# Patient Record
Sex: Male | Born: 1955 | Race: White | Hispanic: No | Marital: Single | Smoking: Current every day smoker
Health system: Southern US, Community
[De-identification: ages and names within clinical notes are randomized; demographics above are authoritative.]

## PROBLEM LIST (undated history)

## (undated) DIAGNOSIS — I219 Acute myocardial infarction, unspecified: Secondary | ICD-10-CM

## (undated) DIAGNOSIS — S065XAA Traumatic subdural hemorrhage with loss of consciousness status unknown, initial encounter: Secondary | ICD-10-CM

## (undated) DIAGNOSIS — I10 Essential (primary) hypertension: Secondary | ICD-10-CM

## (undated) DIAGNOSIS — I509 Heart failure, unspecified: Secondary | ICD-10-CM

## (undated) DIAGNOSIS — I619 Nontraumatic intracerebral hemorrhage, unspecified: Secondary | ICD-10-CM

## (undated) DIAGNOSIS — S065X9A Traumatic subdural hemorrhage with loss of consciousness of unspecified duration, initial encounter: Secondary | ICD-10-CM

## (undated) HISTORY — PX: CRANIECTOMY: SHX331

---

## 2014-09-17 ENCOUNTER — Encounter (HOSPITAL_COMMUNITY): Payer: Self-pay | Admitting: *Deleted

## 2014-09-17 ENCOUNTER — Emergency Department (HOSPITAL_COMMUNITY): Payer: Medicaid Other

## 2014-09-17 ENCOUNTER — Emergency Department (HOSPITAL_COMMUNITY)
Admission: EM | Admit: 2014-09-17 | Discharge: 2014-09-17 | Disposition: A | Discharge status: Home / Self Care | Payer: Medicaid Other | Attending: Emergency Medicine | Admitting: Emergency Medicine

## 2014-09-17 DIAGNOSIS — Z8781 Personal history of (healed) traumatic fracture: Secondary | ICD-10-CM | POA: Diagnosis not present

## 2014-09-17 DIAGNOSIS — R0989 Other specified symptoms and signs involving the circulatory and respiratory systems: Secondary | ICD-10-CM | POA: Insufficient documentation

## 2014-09-17 DIAGNOSIS — R079 Chest pain, unspecified: Secondary | ICD-10-CM | POA: Diagnosis present

## 2014-09-17 DIAGNOSIS — I25118 Atherosclerotic heart disease of native coronary artery with other forms of angina pectoris: Secondary | ICD-10-CM | POA: Insufficient documentation

## 2014-09-17 DIAGNOSIS — Z9861 Coronary angioplasty status: Secondary | ICD-10-CM | POA: Insufficient documentation

## 2014-09-17 DIAGNOSIS — I252 Old myocardial infarction: Secondary | ICD-10-CM | POA: Insufficient documentation

## 2014-09-17 DIAGNOSIS — Z720 Tobacco use: Secondary | ICD-10-CM | POA: Diagnosis not present

## 2014-09-17 DIAGNOSIS — I208 Other forms of angina pectoris: Secondary | ICD-10-CM

## 2014-09-17 DIAGNOSIS — Z79899 Other long term (current) drug therapy: Secondary | ICD-10-CM | POA: Insufficient documentation

## 2014-09-17 DIAGNOSIS — Z87828 Personal history of other (healed) physical injury and trauma: Secondary | ICD-10-CM | POA: Diagnosis not present

## 2014-09-17 DIAGNOSIS — I10 Essential (primary) hypertension: Secondary | ICD-10-CM | POA: Insufficient documentation

## 2014-09-17 HISTORY — DX: Essential (primary) hypertension: I10

## 2014-09-17 HISTORY — DX: Nontraumatic intracerebral hemorrhage, unspecified: I61.9

## 2014-09-17 HISTORY — DX: Acute myocardial infarction, unspecified: I21.9

## 2014-09-17 LAB — BASIC METABOLIC PANEL
ANION GAP: 12 (ref 5–15)
BUN: 24 mg/dL — AB (ref 6–23)
CO2: 24 mmol/L (ref 19–32)
Calcium: 8.3 mg/dL — ABNORMAL LOW (ref 8.4–10.5)
Chloride: 97 mmol/L (ref 96–112)
Creatinine, Ser: 1.09 mg/dL (ref 0.50–1.35)
GFR calc Af Amer: 85 mL/min — ABNORMAL LOW (ref >90)
GFR, EST NON AFRICAN AMERICAN: 74 mL/min — AB (ref >90)
Glucose, Bld: 98 mg/dL (ref 70–99)
Potassium: 4 mmol/L (ref 3.5–5.1)
Sodium: 133 mmol/L — ABNORMAL LOW (ref 135–145)

## 2014-09-17 LAB — TROPONIN I
TROPONIN I: 0.05 ng/mL — AB (ref <0.031)
TROPONIN I: 0.05 ng/mL — AB (ref <0.031)

## 2014-09-17 LAB — CBC WITH DIFFERENTIAL/PLATELET
BASOS ABS: 0 10*3/uL (ref 0.0–0.1)
Basophils Relative: 1 % (ref 0–1)
EOS PCT: 1 % (ref 0–5)
Eosinophils Absolute: 0 10*3/uL (ref 0.0–0.7)
HEMATOCRIT: 40.4 % (ref 39.0–52.0)
Hemoglobin: 12.7 g/dL — ABNORMAL LOW (ref 13.0–17.0)
LYMPHS ABS: 1.3 10*3/uL (ref 0.7–4.0)
Lymphocytes Relative: 34 % (ref 12–46)
MCH: 29.4 pg (ref 26.0–34.0)
MCHC: 31.4 g/dL (ref 30.0–36.0)
MCV: 93.5 fL (ref 78.0–100.0)
Monocytes Absolute: 0.4 10*3/uL (ref 0.1–1.0)
Monocytes Relative: 10 % (ref 3–12)
NEUTROS ABS: 2.1 10*3/uL (ref 1.7–7.7)
NEUTROS PCT: 54 % (ref 43–77)
Platelets: 119 10*3/uL — ABNORMAL LOW (ref 150–400)
RBC: 4.32 MIL/uL (ref 4.22–5.81)
RDW: 16.2 % — AB (ref 11.5–15.5)
WBC: 3.9 10*3/uL — AB (ref 4.0–10.5)

## 2014-09-17 LAB — BRAIN NATRIURETIC PEPTIDE: B Natriuretic Peptide: 1038.1 pg/mL — ABNORMAL HIGH (ref 0.0–100.0)

## 2014-09-17 MED ORDER — NITROGLYCERIN 0.4 MG SL SUBL: 0.4000 mg | SUBLINGUAL_TABLET | SUBLINGUAL | Status: AC | PRN

## 2014-09-17 NOTE — ED Notes (Signed)
The pt fell 10 days ago and  Fractured ribs on thelt middlke ribs.  He has been having chest pain since then

## 2014-09-17 NOTE — ED Notes (Signed)
Patient transported to X-ray 

## 2014-09-17 NOTE — ED Provider Notes (Signed)
CSN: [id number]     Arrival date & time 09/17/14  0003 History  This chart was scribed for Mancel Bale, MD by Tanda Rockers, ED Scribe. This patient was seen in room A08C/A08C and the patient's care was started at 1:04 AM.    Chief Complaint  Patient presents with  . Chest Pain   The history is provided by the patient. No language interpreter was used.   HPI Comments: Mitchell Aguilar is a 59 y.o. male with hx CAD s/p multiple stents, MI, and HTN who presents to the Emergency Department complaining of left sided chest pain that began 8 days ago s/p ground level fall, worsening 1 hour ago. Pt rates the pain as a 7/10 on the pain scale. He also complains of nausea. Pt states that he fell 8 days ago and fractured a couple of ribs, causing the pain. He was seen at [facility] after the fall. Pt mentions he has been taking nitroglycerin PRN (about 3-4 times per week) but is currently out of his medication. He denies shortness of breath, diaphoresis, vomiting, or any other symptoms. He is a current every day smoker and occasional EtOH drinker. Denies recreational drug usage.   Past Medical History  Diagnosis Date  . MI (myocardial infarction)   . Cerebral hemorrhage   . Hypertension    History reviewed. No pertinent past surgical history. No family history on file. History  Substance Use Topics  . Smoking status: Current Every Day Smoker  . Smokeless tobacco: Not on file  . Alcohol Use: Not on file    Review of Systems  Constitutional: Negative for diaphoresis.  Respiratory: Negative for shortness of breath.   Cardiovascular: Positive for chest pain.  Gastrointestinal: Positive for nausea. Negative for vomiting.  All other systems reviewed and are negative.     Allergies  Keflex and Heparin  Home Medications   Prior to Admission medications   Medication Sig Start Date End Date Taking? Authorizing Provider  furosemide (LASIX) 80 MG tablet Take 80 mg by mouth 2 (two) times  daily.   Yes Historical Provider, MD  gabapentin (NEURONTIN) 300 MG capsule Take 300 mg by mouth 3 (three) times daily.   Yes Historical Provider, MD  lisinopril (PRINIVIL,ZESTRIL) 10 MG tablet Take 10 mg by mouth daily.   Yes Historical Provider, MD  nitroGLYCERIN (NITROSTAT) 0.4 MG SL tablet Place 0.4 mg under the tongue every 5 (five) minutes as needed for chest pain.   Yes Historical Provider, MD  oxyCODONE-acetaminophen (PERCOCET/ROXICET) 5-325 MG per tablet Take 1 tablet by mouth 3 (three) times daily as needed for severe pain.   Yes Historical Provider, MD  simvastatin (ZOCOR) 40 MG tablet Take 40 mg by mouth daily.   Yes Historical Provider, MD   Triage Vitals: BP 103/83 mmHg  Pulse 109  Temp(Src) 98.2 F (36.8 C) (Oral)  Resp 28  Wt 192 lb 8 oz (87.317 kg)  SpO2 100%   Physical Exam  Constitutional: He is oriented to person, place, and time. He appears well-developed and well-nourished.  HENT:  Head: Normocephalic.  Right Ear: External ear normal.  Left Ear: External ear normal.  Bruising to left cheek.   Eyes: Conjunctivae and EOM are normal. Pupils are equal, round, and reactive to light.  Neck: Normal range of motion and phonation normal. Neck supple.  Cardiovascular: Normal rate, regular rhythm and normal heart sounds.   Left anterior chest wall tenderness without crepitation. Ecchymosis on left anterior chest wall.   Pulmonary/Chest:  Effort normal. He has rhonchi. He exhibits no bony tenderness.  Bilateral rhonchi left greater than right.    Abdominal: Soft. There is no tenderness.  Musculoskeletal: Normal range of motion.  Neurological: He is alert and oriented to person, place, and time. No cranial nerve deficit or sensory deficit. He exhibits normal muscle tone. Coordination normal.  Skin: Skin is warm, dry and intact.  Psychiatric: He has a normal mood and affect. His behavior is normal. Judgment and thought content normal.  Nursing note and vitals  reviewed.   ED Course  Procedures (including critical care time)  DIAGNOSTIC STUDIES: Oxygen Saturation is 100% on RA, normal by my interpretation.    COORDINATION OF CARE:  Medications - No data to display   Patient Vitals for the past 24 hrs:  BP Temp Temp src Pulse Resp SpO2 Weight  09/17/14 0045 100/76 mmHg - - 97 22 100 % -  09/17/14 0025 103/83 mmHg 98.2 F (36.8 C) Oral 109 (!) 28 100 % 192 lb 8 oz (87.317 kg)     1:10 AM-Discussed treatment plan which includes CXR, CBC, Troponin, BMP, BNP with pt at bedside and pt agreed to plan.  4:32 AM Reevaluation with update and discussion. After initial assessment and treatment, an updated evaluation reveals he is comfortable at this time. Findings discussed with patient, all questions answered.Mancel Bale L    Labs Review Labs Reviewed  BASIC METABOLIC PANEL - Abnormal; Notable for the following:    Sodium 133 (*)    BUN 24 (*)    Calcium 8.3 (*)    GFR calc non Af Amer 74 (*)    GFR calc Af Amer 85 (*)    All other components within normal limits  BRAIN NATRIURETIC PEPTIDE - Abnormal; Notable for the following:    B Natriuretic Peptide 1038.1 (*)    All other components within normal limits  CBC WITH DIFFERENTIAL/PLATELET - Abnormal; Notable for the following:    WBC 3.9 (*)    Hemoglobin 12.7 (*)    RDW 16.2 (*)    Platelets 119 (*)    All other components within normal limits  TROPONIN I - Abnormal; Notable for the following:    Troponin I 0.05 (*)    All other components within normal limits  TROPONIN I    Imaging Review Dg Chest 2 View  09/17/2014   CLINICAL DATA:  Chest pain.  Fall 10 days prior with rib fractures.  EXAM: CHEST  2 VIEW  COMPARISON:  Chest radiograph 09/13/2014  FINDINGS: Single lead pacemaker relies the left chest wall. Cardiomediastinal contours are unchanged, mild cardiomegaly is stable. Curvilinear calcifications again seen in the region of the left ventricle. Diffuse peribronchial  thickening and interstitial prominence is again seen, mildly progressed from prior. Question of developing small left pleural effusion and minimal fluid in the right minor fissure. No pneumothorax. There is no definite confluent airspace disease. Fractures at least 3 contiguous left upper ribs.  IMPRESSION: 1. Diffuse bronchial thickening and interstitial prominence is again seen. This may reflect worsening interstitial edema versus atypical infection. Question of developing small left pleural effusion. 2. Stable borderline mild cardiomegaly and curvilinear calcifications projecting over the region of the left ventricle. 3. Left upper lateral rib fractures.  No pneumothorax.   Electronically Signed   By: Rubye Oaks M.D.   On: 09/17/2014 02:44     EKG Interpretation   Date/Time:  Saturday September 17 2014 00:10:40 EDT Ventricular Rate:  105 PR Interval:  152 QRS Duration: 132 QT Interval:  354 QTC Calculation: 467 R Axis:   -93 Text Interpretation:   Poor data quality, interpretation may be  adversely affected Sinus tachycardia with occasional Premature ventricular  complexes Non-specific intra-ventricular conduction block Lateral infarct  , age undetermined Abnormal ECG No old tracing to compare Confirmed by  OTTER  MD, OLGA ([id number]) on 09/17/2014 12:17:27 AM      MDM   Final diagnoses:  Stable angina   Evaluation consistent with chronic stable angina. No evidence for ACS, PE or pneumonia.   Nursing Notes Reviewed/ Care Coordinated Applicable Imaging Reviewed Interpretation of Laboratory Data incorporated into ED treatment  The patient appears reasonably screened and/or stabilized for discharge and I doubt any other medical condition or other [facility] requiring further screening, evaluation, or treatment in the ED at this time prior to discharge.  Plan: Home Medications- refil NTG Rx; Home Treatments- rest; return here if the recommended treatment, does not improve the symptoms;  Recommended follow up- PCP 1 weel, Cardiology prn    I personally performed the services described in this documentation, which was scribed in my presence. The recorded information has been reviewed and is accurate.       Mancel BaleElliott Mattox Schorr, MD 09/17/14 [phone]

## 2014-09-17 NOTE — Discharge Instructions (Signed)
Angina Pectoris  Angina pectoris, often just called angina, is extreme discomfort in your chest, neck, or arm caused by a lack of blood in the middle and thickest layer of your heart wall (myocardium). It may feel like tightness or heavy pressure. It may feel like a crushing or squeezing pain. Some people say it feels like gas or indigestion. It may go down your shoulders, back, and arms. Some people may have symptoms other than pain. These symptoms include fatigue, shortness of breath, cold sweats, or nausea. There are four different types of angina:  · Stable angina--Stable angina usually occurs in episodes of predictable frequency and duration. It usually is brought on by physical activity, emotional stress, or excitement. These are all times when the myocardium needs more oxygen. Stable angina usually lasts a few minutes and often is relieved by taking a medicine that can be taken under your tongue (sublingually). The medicine is called nitroglycerin. Stable angina is caused by a buildup of plaque inside the arteries, which restricts blood flow to the heart muscle (atherosclerosis).  · Unstable angina--Unstable angina can occur even when your body experiences little or no physical exertion. It can occur during sleep. It can also occur at rest. It can suddenly increase in severity or frequency. It might not be relieved by sublingual nitroglycerin. It can last up to 30 minutes. The most common cause of unstable angina is a blood clot that has developed on the top of plaque buildup inside a coronary artery. It can lead to a heart attack if the blood clot completely blocks the artery.  · Microvascular angina--This type of angina is caused by a disorder of tiny blood vessels called arterioles. Microvascular angina is more common in women. The pain may be more severe and last longer than other types of angina pectoris.  · Prinzmetal or variant angina--This type of angina pectoris usually occurs when your body  experiences little or no physical exertion. It especially occurs in the early morning hours. It is caused by a spasm of your coronary artery.  HOME CARE INSTRUCTIONS   · Only take over-the-counter and prescription medicines as directed by your health care provider.  · Stay active or increase your exercise as directed by your health care provider.  · Limit strenuous activity as directed by your health care provider.  · Limit heavy lifting as directed by your health care provider.  · Maintain a healthy weight.  · Learn about and eat heart-healthy foods.  · Do not use any tobacco products including cigarettes, chewing tobacco or electronic cigarettes.  SEEK IMMEDIATE MEDICAL CARE IF:   You experience the following symptoms:  · Chest, neck, deep shoulder, or arm pain or discomfort that lasts more than a few minutes.  · Chest, neck, deep shoulder, or arm pain or discomfort that goes away and comes back, repeatedly.  · Heavy sweating with discomfort, without a noticeable cause.  · Shortness of breath or difficulty breathing.  · Angina that does not get better after a few minutes of rest or after taking sublingual nitroglycerin.  These can all be symptoms of a heart attack, which is a medical emergency! Get medical help at once. Call your local emergency service (911 in [place] immediately. Do not  drive yourself to the hospital and do not  wait to for your symptoms to go away.  MAKE SURE YOU:  · Understand these instructions.  · Will watch your condition.  · Will get help right away if you are not   doing well or get worse.  Document Released: 05/20/2005 Document Revised: 05/25/2013 Document Reviewed: 09/21/2013  ExitCare® Patient Information ©2015 [employer]. This information is not intended to replace advice given to you by your health care provider. Make sure you discuss any questions you have with your health care provider.

## 2014-09-19 ENCOUNTER — Emergency Department (HOSPITAL_COMMUNITY)
Admission: EM | Admit: 2014-09-19 | Discharge: 2014-09-19 | Discharge status: Against Medical Advice | Payer: Medicaid Other | Attending: Emergency Medicine | Admitting: Emergency Medicine

## 2014-09-19 ENCOUNTER — Emergency Department (HOSPITAL_COMMUNITY): Payer: Medicaid Other

## 2014-09-19 ENCOUNTER — Encounter (HOSPITAL_COMMUNITY): Payer: Self-pay | Admitting: Emergency Medicine

## 2014-09-19 DIAGNOSIS — I252 Old myocardial infarction: Secondary | ICD-10-CM | POA: Diagnosis not present

## 2014-09-19 DIAGNOSIS — I10 Essential (primary) hypertension: Secondary | ICD-10-CM | POA: Diagnosis not present

## 2014-09-19 DIAGNOSIS — Z720 Tobacco use: Secondary | ICD-10-CM | POA: Insufficient documentation

## 2014-09-19 DIAGNOSIS — R079 Chest pain, unspecified: Secondary | ICD-10-CM | POA: Insufficient documentation

## 2014-09-19 LAB — CBC
HCT: 38.7 % — ABNORMAL LOW (ref 39.0–52.0)
Hemoglobin: 12.2 g/dL — ABNORMAL LOW (ref 13.0–17.0)
MCH: 29.1 pg (ref 26.0–34.0)
MCHC: 31.5 g/dL (ref 30.0–36.0)
MCV: 92.4 fL (ref 78.0–100.0)
Platelets: 109 10*3/uL — ABNORMAL LOW (ref 150–400)
RBC: 4.19 MIL/uL — ABNORMAL LOW (ref 4.22–5.81)
RDW: 16.5 % — AB (ref 11.5–15.5)
WBC: 6.6 10*3/uL (ref 4.0–10.5)

## 2014-09-19 LAB — BASIC METABOLIC PANEL
Anion gap: 11 (ref 5–15)
BUN: 19 mg/dL (ref 6–23)
CALCIUM: 8.6 mg/dL (ref 8.4–10.5)
CHLORIDE: 103 mmol/L (ref 96–112)
CO2: 24 mmol/L (ref 19–32)
CREATININE: 0.87 mg/dL (ref 0.50–1.35)
GFR calc Af Amer: >90 mL/min (ref >90)
GFR calc non Af Amer: >90 mL/min (ref >90)
GLUCOSE: 119 mg/dL — AB (ref 70–99)
Potassium: 4 mmol/L (ref 3.5–5.1)
Sodium: 138 mmol/L (ref 135–145)

## 2014-09-19 LAB — I-STAT TROPONIN, ED: TROPONIN I, POC: 0.02 ng/mL (ref 0.00–0.08)

## 2014-09-19 LAB — BRAIN NATRIURETIC PEPTIDE: B NATRIURETIC PEPTIDE 5: 1395.1 pg/mL — AB (ref 0.0–100.0)

## 2014-09-19 MED ORDER — NITROGLYCERIN 0.4 MG SL SUBL: 0.4000 mg | SUBLINGUAL_TABLET | SUBLINGUAL | Status: DC | PRN

## 2014-09-19 NOTE — ED Notes (Signed)
Per EMS: coming from home.  Woke up with cp around 0300, left side below nipple, 7/10, pressure sensation, lung sounds clear, 324 asa given.  LBBB, NSR with occasional PVC.  Last VS: 140/96, 102 bpm, 100% RA.

## 2014-09-19 NOTE — ED Notes (Addendum)
Patient was being confrontational with EMS when they had arrived to this facility.  Patient yelled obscenities at both paramedics and called the young male medic a "candy striper."

## 2014-09-19 NOTE — ED Notes (Addendum)
Immediately after the radiology tech had left room, patient began screaming furiously that he needed nitroglycerin and for everyone to "get off their a**."  I explained to the patient that yelling like that was not going to be tolerated and that an IV would have to be established before nitroglycerin would be administered.  Patient continued to yell and be confrontational when I went to establish IV access.   Patient then told me to leave the room and continued to yell obscenities.    I asked the pod B secretary to please call security and for Dr. Rhunette CroftNanavati to come see the patient since he then threatened to leave AMA.  When I returned to the room, a patient from Pod C had come to the room and was being confrontational with the patient.  At this point, security and police removed the pod C patient.    Dr. Rhunette CroftNanavati spoke with patient and attempted to persuade patient to remain and he adamantly refused.  Patient got dressed and left against medical advice.

## 2014-10-01 ENCOUNTER — Inpatient Hospital Stay (HOSPITAL_COMMUNITY)
Admission: EM | Admit: 2014-10-01 | Discharge: 2014-10-03 | DRG: 204 | Disposition: A | Discharge status: Home / Self Care | Payer: Medicaid Other | Attending: Internal Medicine | Admitting: Internal Medicine

## 2014-10-01 ENCOUNTER — Emergency Department (HOSPITAL_COMMUNITY): Payer: Medicaid Other

## 2014-10-01 ENCOUNTER — Encounter (HOSPITAL_COMMUNITY): Payer: Self-pay | Admitting: Internal Medicine

## 2014-10-01 DIAGNOSIS — I2510 Atherosclerotic heart disease of native coronary artery without angina pectoris: Secondary | ICD-10-CM | POA: Diagnosis present

## 2014-10-01 DIAGNOSIS — R079 Chest pain, unspecified: Secondary | ICD-10-CM | POA: Diagnosis present

## 2014-10-01 DIAGNOSIS — I252 Old myocardial infarction: Secondary | ICD-10-CM

## 2014-10-01 DIAGNOSIS — Z881 Allergy status to other antibiotic agents status: Secondary | ICD-10-CM

## 2014-10-01 DIAGNOSIS — I159 Secondary hypertension, unspecified: Secondary | ICD-10-CM

## 2014-10-01 DIAGNOSIS — Z9981 Dependence on supplemental oxygen: Secondary | ICD-10-CM

## 2014-10-01 DIAGNOSIS — F17210 Nicotine dependence, cigarettes, uncomplicated: Secondary | ICD-10-CM | POA: Diagnosis present

## 2014-10-01 DIAGNOSIS — R0781 Pleurodynia: Principal | ICD-10-CM | POA: Diagnosis present

## 2014-10-01 DIAGNOSIS — Z9181 History of falling: Secondary | ICD-10-CM

## 2014-10-01 DIAGNOSIS — Z955 Presence of coronary angioplasty implant and graft: Secondary | ICD-10-CM

## 2014-10-01 DIAGNOSIS — I10 Essential (primary) hypertension: Secondary | ICD-10-CM | POA: Diagnosis present

## 2014-10-01 DIAGNOSIS — Z95810 Presence of automatic (implantable) cardiac defibrillator: Secondary | ICD-10-CM

## 2014-10-01 DIAGNOSIS — I25118 Atherosclerotic heart disease of native coronary artery with other forms of angina pectoris: Secondary | ICD-10-CM

## 2014-10-01 DIAGNOSIS — I517 Cardiomegaly: Secondary | ICD-10-CM | POA: Diagnosis present

## 2014-10-01 DIAGNOSIS — Z79891 Long term (current) use of opiate analgesic: Secondary | ICD-10-CM

## 2014-10-01 DIAGNOSIS — Z79899 Other long term (current) drug therapy: Secondary | ICD-10-CM

## 2014-10-01 DIAGNOSIS — Z888 Allergy status to other drugs, medicaments and biological substances status: Secondary | ICD-10-CM

## 2014-10-01 DIAGNOSIS — I509 Heart failure, unspecified: Secondary | ICD-10-CM | POA: Diagnosis present

## 2014-10-01 DIAGNOSIS — Z8249 Family history of ischemic heart disease and other diseases of the circulatory system: Secondary | ICD-10-CM

## 2014-10-01 DIAGNOSIS — Z8679 Personal history of other diseases of the circulatory system: Secondary | ICD-10-CM

## 2014-10-01 HISTORY — DX: Heart failure, unspecified: I50.9

## 2014-10-01 HISTORY — DX: Traumatic subdural hemorrhage with loss of consciousness status unknown, initial encounter: S06.5XAA

## 2014-10-01 HISTORY — DX: Traumatic subdural hemorrhage with loss of consciousness of unspecified duration, initial encounter: S06.5X9A

## 2014-10-01 LAB — PROTIME-INR
INR: 1.29 (ref 0.00–1.49)
Prothrombin Time: 16.2 seconds — ABNORMAL HIGH (ref 11.6–15.2)

## 2014-10-01 LAB — CBC
HCT: 37.3 % — ABNORMAL LOW (ref 39.0–52.0)
Hemoglobin: 12 g/dL — ABNORMAL LOW (ref 13.0–17.0)
MCH: 29.9 pg (ref 26.0–34.0)
MCHC: 32.2 g/dL (ref 30.0–36.0)
MCV: 93 fL (ref 78.0–100.0)
Platelets: 129 10*3/uL — ABNORMAL LOW (ref 150–400)
RBC: 4.01 MIL/uL — ABNORMAL LOW (ref 4.22–5.81)
RDW: 16.5 % — ABNORMAL HIGH (ref 11.5–15.5)
WBC: 4.6 10*3/uL (ref 4.0–10.5)

## 2014-10-01 LAB — BASIC METABOLIC PANEL
Anion gap: 5 (ref 5–15)
BUN: 17 mg/dL (ref 6–23)
CO2: 25 mmol/L (ref 19–32)
Calcium: 8.4 mg/dL (ref 8.4–10.5)
Chloride: 109 mmol/L (ref 96–112)
Creatinine, Ser: 0.86 mg/dL (ref 0.50–1.35)
GFR calc Af Amer: >90 mL/min (ref >90)
GFR calc non Af Amer: >90 mL/min (ref >90)
Glucose, Bld: 129 mg/dL — ABNORMAL HIGH (ref 70–99)
Potassium: 3.3 mmol/L — ABNORMAL LOW (ref 3.5–5.1)
Sodium: 139 mmol/L (ref 135–145)

## 2014-10-01 LAB — I-STAT TROPONIN, ED: Troponin i, poc: 0.01 ng/mL (ref 0.00–0.08)

## 2014-10-01 LAB — APTT: aPTT: 39 seconds — ABNORMAL HIGH (ref 24–37)

## 2014-10-01 LAB — BRAIN NATRIURETIC PEPTIDE: B Natriuretic Peptide: 899.2 pg/mL — ABNORMAL HIGH (ref 0.0–100.0)

## 2014-10-01 MED ORDER — MORPHINE SULFATE 4 MG/ML IJ SOLN
4.0000 mg | Freq: Once | INTRAMUSCULAR | Status: AC
  Administered 2014-10-01: 4 mg via INTRAVENOUS
  Filled 2014-10-01: qty 1

## 2014-10-01 MED ORDER — NITROGLYCERIN 0.4 MG SL SUBL
0.4000 mg | SUBLINGUAL_TABLET | SUBLINGUAL | Status: DC | PRN
  Administered 2014-10-01: 0.4 mg via SUBLINGUAL
  Filled 2014-10-01: qty 1

## 2014-10-01 MED ORDER — SODIUM CHLORIDE 0.9 % IV SOLN
1000.0000 mL | INTRAVENOUS | Status: DC
  Administered 2014-10-01: 1000 mL via INTRAVENOUS

## 2014-10-01 MED ORDER — NITROGLYCERIN 2 % TD OINT
1.0000 [in_us] | TOPICAL_OINTMENT | Freq: Once | TRANSDERMAL | Status: AC
  Administered 2014-10-01: 1 [in_us] via TOPICAL
  Filled 2014-10-01: qty 30

## 2014-10-01 MED ORDER — ASPIRIN 81 MG PO CHEW
324.0000 mg | CHEWABLE_TABLET | Freq: Once | ORAL | Status: AC
  Administered 2014-10-01: 324 mg via ORAL
  Filled 2014-10-01: qty 4

## 2014-10-01 NOTE — ED Notes (Signed)
Patient with hx of MI x 5 with stent placement (last time in 2015) Patient also with pacer placement Patient from [place], so no local cards doctor as of yet Patient also with CHF Patient with c/o SOB and states, "It feels like something is sitting on my chest" Dr. Lynelle DoctorKnapp at bedside

## 2014-10-01 NOTE — ED Notes (Signed)
Patient continues to frequently call out for toiletry items, food, socks Patient remains in NAD

## 2014-10-01 NOTE — ED Notes (Signed)
Patient reports that he has run out of his cardiac medications and requires home O2, but does not have a PCP or cardiac provider at this time [place] already aware of this issue

## 2014-10-01 NOTE — ED Notes (Signed)
Patient has removed himself from the monitor and is ambulating around room without difficulty Patient continues to remain in NAD Hospitalist at bedside

## 2014-10-01 NOTE — ED Provider Notes (Signed)
CSN: [id number]     Arrival date & time 10/01/14  2120 History   First MD Initiated Contact with Patient 10/01/14 2135     Chief Complaint  Patient presents with  . Chest Pain   HPI Patient has a history of coronary artery disease. The patient states he's had 5 stents in the past and 5 myocardial infarctions. Last time he had a stent was in 2015. Patient also has an AICD. Patient was living in [place] and just moved here. Patient states he started having chest pain about 45 minutes ago. It feels like something is sitting on his chest. He has shortness of breath but no diaphoresis or nausea. The discomfort does not radiate. Patient states he also has a history of CHF but has not noticed any new swelling. He he is a smoker and has been coughing bringing up mucus. He denies any fevers or chills. Past Medical History  Diagnosis Date  . MI (myocardial infarction)   . Cerebral hemorrhage   . Hypertension    No past surgical history on file. No family history on file. History  Substance Use Topics  . Smoking status: Current Every Day Smoker  . Smokeless tobacco: Not on file  . Alcohol Use: Yes     Comment: occasional    Review of Systems  All other systems reviewed and are negative.     Allergies  Keflex and Heparin  Home Medications   Prior to Admission medications   Medication Sig Start Date End Date Taking? Authorizing Provider  furosemide (LASIX) 80 MG tablet Take 80 mg by mouth 2 (two) times daily.   Yes Historical Provider, MD  gabapentin (NEURONTIN) 300 MG capsule Take 300 mg by mouth 3 (three) times daily.   Yes Historical Provider, MD  lisinopril (PRINIVIL,ZESTRIL) 10 MG tablet Take 10 mg by mouth daily.   Yes Historical Provider, MD  nitroGLYCERIN (NITROSTAT) 0.4 MG SL tablet Place 1 tablet (0.4 mg total) under the tongue every 5 (five) minutes as needed for chest pain. 09/17/14  Yes Mancel Bale, MD  oxyCODONE-acetaminophen (PERCOCET/ROXICET) 5-325 MG per tablet Take 1  tablet by mouth 3 (three) times daily as needed for severe pain.   Yes Historical Provider, MD  simvastatin (ZOCOR) 40 MG tablet Take 40 mg by mouth daily.   Yes Historical Provider, MD   BP 120/85 mmHg  Pulse 94  Temp(Src) 98.6 F (37 C) (Oral)  Resp 27  SpO2 91% Physical Exam  Constitutional: No distress.  HENT:  Head: Normocephalic and atraumatic.  Right Ear: External ear normal.  Left Ear: External ear normal.  Eyes: Conjunctivae are normal. Right eye exhibits no discharge. Left eye exhibits no discharge. No scleral icterus.  Neck: Neck supple. No tracheal deviation present.  Cardiovascular: Normal rate, regular rhythm and intact distal pulses.   Pulmonary/Chest: Effort normal and breath sounds normal. No stridor. No respiratory distress. He has no wheezes. He has no rales.  Abdominal: Soft. Bowel sounds are normal. He exhibits no distension. There is no tenderness. There is no rebound and no guarding.  Musculoskeletal: He exhibits no edema or tenderness.  Neurological: He is alert. He has normal strength. No cranial nerve deficit (no facial droop, extraocular movements intact, no slurred speech) or sensory deficit. He exhibits normal muscle tone. He displays no seizure activity. Coordination normal.  Skin: Skin is warm and dry. No rash noted. He is not diaphoretic.  Psychiatric: He has a normal mood and affect.  Nursing note and vitals reviewed.  ED Course  Procedures (including critical care time) Labs Review Labs Reviewed  CBC - Abnormal; Notable for the following:    RBC 4.01 (*)    Hemoglobin 12.0 (*)    HCT 37.3 (*)    RDW 16.5 (*)    Platelets 129 (*)    All other components within normal limits  BASIC METABOLIC PANEL - Abnormal; Notable for the following:    Potassium 3.3 (*)    Glucose, Bld 129 (*)    All other components within normal limits  BRAIN NATRIURETIC PEPTIDE - Abnormal; Notable for the following:    B Natriuretic Peptide 899.2 (*)    All other  components within normal limits  APTT - Abnormal; Notable for the following:    aPTT 39 (*)    All other components within normal limits  PROTIME-INR - Abnormal; Notable for the following:    Prothrombin Time 16.2 (*)    All other components within normal limits  I-STAT TROPOININ, ED  Rosezena SensorI-STAT TROPOININ, ED    Imaging Review Dg Chest Port 1 View  10/01/2014   CLINICAL DATA:  Acute onset of shortness of breath and chest pressure. Initial encounter.  EXAM: PORTABLE CHEST - 1 VIEW  COMPARISON:  Chest radiograph performed 09/24/2014  FINDINGS: The lungs are well-aerated. Vascular congestion is noted, with bilateral central airspace opacities and increased interstitial markings, concerning for pulmonary edema. No pleural effusion or pneumothorax is seen, though the costophrenic angles are incompletely imaged on this study.  The cardiomediastinal silhouette is mildly enlarged. An AICD is noted overlying the left chest wall, with lead ending overlying the right ventricle. No acute osseous abnormalities are seen.  IMPRESSION: Vascular congestion and mild cardiomegaly, with bilateral central airspace opacities, concerning for pulmonary edema.   Electronically Signed   By: Roanna RaiderJeffery  Chang M.D.   On: 10/01/2014 22:11     EKG Interpretation   Date/Time:  Saturday October 01 2014 21:30:02 EDT Ventricular Rate:  98 PR Interval:  158 QRS Duration: 139 QT Interval:  389 QTC Calculation: 497 R Axis:   -87 Text Interpretation:  Sinus tachycardia Ventricular premature complex  Nonspecific IVCD with LAD Left ventricular hypertrophy Probable lateral  infarct, age indeterminate Anterior Q waves, possibly due to LVH  Nonspecific T abnormalities, lateral leads No significant change since  last tracing Confirmed by Nelida Mandarino  MD-J, Kahari Critzer ([id number]) on 10/01/2014 11:08:15  PM     Medications  0.9 %  sodium chloride infusion (1,000 mLs Intravenous New Bag/Given 10/01/14 2156)  nitroGLYCERIN (NITROSTAT) SL tablet 0.4 mg  (0.4 mg Sublingual Given 10/01/14 2151)  aspirin chewable tablet 324 mg (324 mg Oral Given 10/01/14 2152)  nitroGLYCERIN (NITROGLYN) 2 % ointment 1 inch (1 inch Topical Given 10/01/14 2151)  morphine 4 MG/ML injection 4 mg (4 mg Intravenous Given 10/01/14 2152)    MDM   Final diagnoses:  Chest pain, unspecified chest pain type    Patient's symptoms improved with nitroglycerin and morphine. Initial troponin is normal. EKG does not suggest acute ischemia. Patient has known history of coronary artery disease and is presenting with symptoms concerning for unstable angina. Patient does not have a primary care doctor or cardiologist in this area. I will consult the medical service regarding admission for serial enzymes and further evaluation.   Linwood DibblesJon Lael Wetherbee, MD 10/01/14 [phone]

## 2014-10-01 NOTE — ED Notes (Signed)
Bed: [id number] Expected date:  Expected time:  Means of arrival:  Comments: 24M CP/hx of MI

## 2014-10-01 NOTE — ED Notes (Signed)
Patient repeatedly calling out for a meal tray and drink Patient has been informed multiple times by multiple ED staff that he is to remain NPO until testing completed Patient remains in NAD

## 2014-10-01 NOTE — ED Notes (Signed)
CXR at bedside

## 2014-10-01 NOTE — ED Notes (Signed)
Pt transported from home with c/o L sided CP dull/aching, non radiating onset 2030. Denies n/v/d, denies SHOB. Unable to est IV, pt states he took Nitro x 2 at home with no relief.

## 2014-10-01 NOTE — H&P (Addendum)
PCP: none here, states he is from [place]   Referring provider Linwood Dibbles   Chief Complaint: Chest pain   HPI: Mitchell Aguilar is a 59 y.o. male   has a past medical history of MI (myocardial infarction); Cerebral hemorrhage; and Hypertension.   Patient reports history of coronary diureses status post multiple stents as well as an MI. Patient reports he had 5 stents thinks last stent was October 2015 and was having episodes of tachycardia. He is sp AICD. He has hx of recuret subdural hematomas last was 5-6 years ago.  Prior to this he was living in [place]. Patient has sustained a fall last month resulting in left sided rib fractures and have been having intermittent chest pain. Patient has been self treating himself with nitroglycerin for chest pain but it doesn't always help. Patietn has run out hs lasix yesterday.  Patient has been seen in the emergency department for the same on the 16th as well as 18th. On the 18th he has left AGAINST MEDICAL ADVICE after becoming confrontational with medical staff. Today He developed chest pain and called EMS states he to 2 nitroglycerin at home without much relief. Patient reports the pain she feels like somebody sitting his chest associated with shortness of breath and radiated to left arm. He feels like it radiated to his feet. Patient had endorse that he has ran out of his home medications he also states that he supposed to be on 2L of  home oxygen chest x-ray is significant for AICD device and cardiomegaly but no evidence of pneumonia. Patient denied any fevers or chills he did some endorse some shortness of breath. Patient continues to smoke. He reports occasional cough. Patient used to go to [place] but upset because they are concentrating on his psychiatric problems. Patient has had trouble with short memory and violent verbal outbursts. Patient endorses being bipolar. He reports doing better on Zoloft.    Hospitalist was called for admission for chest  pain   Review of Systems:    Pertinent positives include:  chest pain, shortness of breath at rest.   Constitutional:  No weight loss, night sweats, Fevers, chills, fatigue, weight loss  HEENT:  No headaches, Difficulty swallowing,Tooth/dental problems,Sore throat,  No sneezing, itching, ear ache, nasal congestion, post nasal drip,  Cardio-vascular:  No  Orthopnea, PND, anasarca, dizziness, palpitations.no Bilateral lower extremity swelling  GI:  No heartburn, indigestion, abdominal pain, nausea, vomiting, diarrhea, change in bowel habits, loss of appetite, melena, blood in stool, hematemesis Resp:  noNo dyspnea on exertion, No excess mucus, no productive cough, No non-productive cough, No coughing up of blood.No change in color of mucus.No wheezing. Skin:  no rash or lesions. No jaundice GU:  no dysuria, change in color of urine, no urgency or frequency. No straining to urinate.  No flank pain.  Musculoskeletal:  No joint pain or no joint swelling. No decreased range of motion. No back pain.  Psych:  No change in mood or affect. No depression or anxiety. No memory loss.  Neuro: no localizing neurological complaints, no tingling, no weakness, no double vision, no gait abnormality, no slurred speech, no confusion  Otherwise ROS are negative except for above, 10 systems were reviewed  Past Medical History: Past Medical History  Diagnosis Date  . MI (myocardial infarction)   . Cerebral hemorrhage   . Hypertension    History reviewed. No pertinent past surgical history.   Medications: Prior to Admission medications   Medication Sig Start Date  End Date Taking? Authorizing Provider  furosemide (LASIX) 80 MG tablet Take 80 mg by mouth 2 (two) times daily.   Yes Historical Provider, MD  gabapentin (NEURONTIN) 300 MG capsule Take 300 mg by mouth 3 (three) times daily.   Yes Historical Provider, MD  lisinopril (PRINIVIL,ZESTRIL) 10 MG tablet Take 10 mg by mouth daily.   Yes  Historical Provider, MD  nitroGLYCERIN (NITROSTAT) 0.4 MG SL tablet Place 1 tablet (0.4 mg total) under the tongue every 5 (five) minutes as needed for chest pain. 09/17/14  Yes Mancel Bale, MD  oxyCODONE-acetaminophen (PERCOCET/ROXICET) 5-325 MG per tablet Take 1 tablet by mouth 3 (three) times daily as needed for severe pain.   Yes Historical Provider, MD  simvastatin (ZOCOR) 40 MG tablet Take 40 mg by mouth daily.   Yes Historical Provider, MD    Allergies:   Allergies  Allergen Reactions  . Keflex [Cephalexin] Anaphylaxis  . Heparin Other (See Comments)    All over bruising     Social History:  Ambulatory   independently   Lives at home alone at an apartment, he does not drive.      reports that he has been smoking.  He does not have any smokeless tobacco history on file. He reports that he drinks alcohol. He reports that he does not use illicit drugs.    Family History: family history includes CAD in his mother.    Physical Exam: Patient Vitals for the past 24 hrs:  BP Temp Temp src Pulse Resp SpO2  10/01/14 2300 120/85 mmHg - - 94 (!) 27 91 %  10/01/14 2230 121/85 mmHg - - 104 26 95 %  10/01/14 2215 121/80 mmHg - - 100 24 95 %  10/01/14 2200 106/87 mmHg - - 96 18 97 %  10/01/14 2157 109/79 mmHg - - 96 20 97 %  10/01/14 2156 112/80 mmHg - - 97 17 97 %  10/01/14 2151 109/78 mmHg - - 94 20 97 %  10/01/14 2130 110/71 mmHg 98.6 F (37 C) Oral 98 21 92 %  10/01/14 2124 - - - - - 95 %    1. General:  in No Acute distress 2. Psychological: Alert and   Oriented 3. Head/ENT:    Dry Mucous Membranes                          Head Non traumatic, neck supple                          Normal   Dentition 4. SKIN:   decreased Skin turgor,  Skin clean Dry and intact no rash 5. Heart: Regular rate and rhythm no Murmur, Rub or gallop 6. Lungs:  no wheezes occasional crackles   7. Abdomen: Soft, non-tender, Non distended 8. Lower extremities: no clubbing, cyanosis, right worse  than left edema, states that is chronic 9. Neurologically Grossly intact, moving all 4 extremities equally 10. MSK: Normal range of motion  body mass index is unknown because there is no weight on file.   Labs on Admission:   Results for orders placed or performed during the hospital encounter of 10/01/14 (from the past 24 hour(s))  CBC     Status: Abnormal   Collection Time: 10/01/14  9:39 PM  Result Value Ref Range   WBC 4.6 4.0 - 10.5 K/uL   RBC 4.01 (L) 4.22 - 5.81 MIL/uL   Hemoglobin 12.0 (L)  13.0 - 17.0 g/dL   HCT 16.137.3 (L) 09.639.0 - 04.552.0 %   MCV 93.0 78.0 - 100.0 fL   MCH 29.9 26.0 - 34.0 pg   MCHC 32.2 30.0 - 36.0 g/dL   RDW 40.916.5 (H) 81.111.5 - 91.415.5 %   Platelets 129 (L) 150 - 400 K/uL  Basic metabolic panel     Status: Abnormal   Collection Time: 10/01/14  9:39 PM  Result Value Ref Range   Sodium 139 135 - 145 mmol/L   Potassium 3.3 (L) 3.5 - 5.1 mmol/L   Chloride 109 96 - 112 mmol/L   CO2 25 19 - 32 mmol/L   Glucose, Bld 129 (H) 70 - 99 mg/dL   BUN 17 6 - 23 mg/dL   Creatinine, Ser 7.820.86 0.50 - 1.35 mg/dL   Calcium 8.4 8.4 - 95.610.5 mg/dL   GFR calc non Af Amer >90 >90 mL/min   GFR calc Af Amer >90 >90 mL/min   Anion gap 5 5 - 15  BNP (order ONLY if patient complains of dyspnea/SOB AND you have documented it for THIS visit)     Status: Abnormal   Collection Time: 10/01/14  9:39 PM  Result Value Ref Range   B Natriuretic Peptide 899.2 (H) 0.0 - 100.0 pg/mL  APTT     Status: Abnormal   Collection Time: 10/01/14  9:39 PM  Result Value Ref Range   aPTT 39 (H) 24 - 37 seconds  Protime-INR     Status: Abnormal   Collection Time: 10/01/14  9:39 PM  Result Value Ref Range   Prothrombin Time 16.2 (H) 11.6 - 15.2 seconds   INR 1.29 0.00 - 1.49  I-stat troponin, ED ([facility])     Status: None   Collection Time: 10/01/14  9:45 PM  Result Value Ref Range   Troponin i, poc 0.01 0.00 - 0.08 ng/mL   Comment 3            UA not obtained  No results found for: HGBA1C    Estimated Creatinine Clearance: 97.9 mL/min (by C-G formula based on Cr of 0.86).  BNP (last 3 results) No results for input(s): PROBNP in the last 8760 hours.  Other results:  I have pearsonaly reviewed this: ECG REPORT  Rate: 98  Rhythm: Sinus tachycardia with intraventricular delay evidence of LVH ST&T Change: No significant ischemic changes from prior last EKG was done April 18   There were no vitals filed for this visit.   Cultures: No results found for: SDES, SPECREQUEST, CULT, REPTSTATUS   Radiological Exams on Admission: Dg Chest Port 1 View  10/01/2014   CLINICAL DATA:  Acute onset of shortness of breath and chest pressure. Initial encounter.  EXAM: PORTABLE CHEST - 1 VIEW  COMPARISON:  Chest radiograph performed 09/24/2014  FINDINGS: The lungs are well-aerated. Vascular congestion is noted, with bilateral central airspace opacities and increased interstitial markings, concerning for pulmonary edema. No pleural effusion or pneumothorax is seen, though the costophrenic angles are incompletely imaged on this study.  The cardiomediastinal silhouette is mildly enlarged. An AICD is noted overlying the left chest wall, with lead ending overlying the right ventricle. No acute osseous abnormalities are seen.  IMPRESSION: Vascular congestion and mild cardiomegaly, with bilateral central airspace opacities, concerning for pulmonary edema.   Electronically Signed   By: Roanna RaiderJeffery  Chang M.D.   On: 10/01/2014 22:11    Chart has been reviewed  Family   at  Bedside    Assessment/Plan  59 year old gentleman suffers report a history of coronary artery disease hypertension and heart failure on home oxygen at 2 L moved here from [place] and since then has been out of his medications as well as is on oxygen. Presents with worsening shortness of breath and chest pain. Chest x-ray worrisome for mild pulmonary congestion and cardiomegaly. Of note patient is status post AICD.     Present on  Admission:  . CAD (coronary artery disease) - given intermittent chest pain which could be related to CHF but also could be anginal equivalent for admit to telemetry cycle cardiac enzymes obtain echogram he will need to establish care with cardiology  . Chest pain somewhat atypical but will cycle cardiac enzymes given significant history  . S/P implantation of automatic cardioverter/defibrillator (AICD) - it is unclear what was the cause for ICD implantation will obtain echogram to evaluate EF per history patient may have episodes of nonsustained V. tach  . Hypertension - restart home medications Acute on chronic CHF exacerbation - patient has run out of his Lasix. Chest x-ray significant for mild congestion. We'll give 1 dose of Lasix IV and then restart his Lasix. Continue to monitor closely. Patient has history of subdural hematomas with resulting short memory loss as well as mood lability. Patient states that in the past he has done better with Zoloft will restart low-dose and monitor. Patient likely benefit from follow-up with psychiatry as an outpatient.   Prophylaxis:  Allergic to heparin. We'll do SCDs for now   CODE STATUS:  FULL CODE  as per patient    Brother is MPOA Shon Hough [id number]  Disposition:  To home once workup is complete and patient is stable  Other plan as per orders.  I have spent a total of 55 min on this admission  Sua Spadafora 10/01/2014, 11:24 PM  [facility]  Pager [phone]   after 2 AM please page floor coverage PA If 7AM-7PM, please contact the day team taking care of the patient  [email or web address]  Password [email or web address]

## 2014-10-02 ENCOUNTER — Encounter (HOSPITAL_COMMUNITY): Payer: Self-pay

## 2014-10-02 DIAGNOSIS — I509 Heart failure, unspecified: Secondary | ICD-10-CM

## 2014-10-02 DIAGNOSIS — I252 Old myocardial infarction: Secondary | ICD-10-CM | POA: Diagnosis not present

## 2014-10-02 DIAGNOSIS — Z8249 Family history of ischemic heart disease and other diseases of the circulatory system: Secondary | ICD-10-CM | POA: Diagnosis not present

## 2014-10-02 DIAGNOSIS — I517 Cardiomegaly: Secondary | ICD-10-CM

## 2014-10-02 DIAGNOSIS — Z8679 Personal history of other diseases of the circulatory system: Secondary | ICD-10-CM | POA: Diagnosis not present

## 2014-10-02 DIAGNOSIS — Z95810 Presence of automatic (implantable) cardiac defibrillator: Secondary | ICD-10-CM

## 2014-10-02 DIAGNOSIS — R0781 Pleurodynia: Secondary | ICD-10-CM | POA: Diagnosis not present

## 2014-10-02 DIAGNOSIS — F17210 Nicotine dependence, cigarettes, uncomplicated: Secondary | ICD-10-CM | POA: Diagnosis present

## 2014-10-02 DIAGNOSIS — I2510 Atherosclerotic heart disease of native coronary artery without angina pectoris: Secondary | ICD-10-CM | POA: Diagnosis present

## 2014-10-02 DIAGNOSIS — I159 Secondary hypertension, unspecified: Secondary | ICD-10-CM | POA: Diagnosis not present

## 2014-10-02 DIAGNOSIS — R079 Chest pain, unspecified: Secondary | ICD-10-CM | POA: Insufficient documentation

## 2014-10-02 DIAGNOSIS — Z881 Allergy status to other antibiotic agents status: Secondary | ICD-10-CM | POA: Diagnosis not present

## 2014-10-02 DIAGNOSIS — Z888 Allergy status to other drugs, medicaments and biological substances status: Secondary | ICD-10-CM | POA: Diagnosis not present

## 2014-10-02 DIAGNOSIS — Z79891 Long term (current) use of opiate analgesic: Secondary | ICD-10-CM | POA: Diagnosis not present

## 2014-10-02 DIAGNOSIS — Z955 Presence of coronary angioplasty implant and graft: Secondary | ICD-10-CM | POA: Diagnosis not present

## 2014-10-02 DIAGNOSIS — I25118 Atherosclerotic heart disease of native coronary artery with other forms of angina pectoris: Secondary | ICD-10-CM | POA: Diagnosis not present

## 2014-10-02 DIAGNOSIS — I370 Nonrheumatic pulmonary valve stenosis: Secondary | ICD-10-CM

## 2014-10-02 DIAGNOSIS — Z9981 Dependence on supplemental oxygen: Secondary | ICD-10-CM | POA: Diagnosis not present

## 2014-10-02 DIAGNOSIS — Z9181 History of falling: Secondary | ICD-10-CM | POA: Diagnosis not present

## 2014-10-02 DIAGNOSIS — Z79899 Other long term (current) drug therapy: Secondary | ICD-10-CM | POA: Diagnosis not present

## 2014-10-02 DIAGNOSIS — I10 Essential (primary) hypertension: Secondary | ICD-10-CM | POA: Diagnosis present

## 2014-10-02 LAB — CBC WITH DIFFERENTIAL/PLATELET
Basophils Absolute: 0.1 10*3/uL (ref 0.0–0.1)
Basophils Relative: 1 % (ref 0–1)
EOS PCT: 5 % (ref 0–5)
Eosinophils Absolute: 0.2 10*3/uL (ref 0.0–0.7)
HEMATOCRIT: 39.9 % (ref 39.0–52.0)
HEMOGLOBIN: 12.3 g/dL — AB (ref 13.0–17.0)
Lymphocytes Relative: 51 % — ABNORMAL HIGH (ref 12–46)
Lymphs Abs: 2.7 10*3/uL (ref 0.7–4.0)
MCH: 29.1 pg (ref 26.0–34.0)
MCHC: 30.8 g/dL (ref 30.0–36.0)
MCV: 94.5 fL (ref 78.0–100.0)
MONO ABS: 0.7 10*3/uL (ref 0.1–1.0)
Monocytes Relative: 13 % — ABNORMAL HIGH (ref 3–12)
Neutro Abs: 1.6 10*3/uL — ABNORMAL LOW (ref 1.7–7.7)
Neutrophils Relative %: 30 % — ABNORMAL LOW (ref 43–77)
Platelets: 126 10*3/uL — ABNORMAL LOW (ref 150–400)
RBC: 4.22 MIL/uL (ref 4.22–5.81)
RDW: 16.5 % — ABNORMAL HIGH (ref 11.5–15.5)
WBC: 5.2 10*3/uL (ref 4.0–10.5)

## 2014-10-02 LAB — HEPATIC FUNCTION PANEL
ALBUMIN: 3.1 g/dL — AB (ref 3.5–5.0)
ALT: 22 U/L (ref 17–63)
AST: 36 U/L (ref 15–41)
Alkaline Phosphatase: 95 U/L (ref 38–126)
Bilirubin, Direct: 0.2 mg/dL (ref 0.1–0.5)
Indirect Bilirubin: 0.5 mg/dL (ref 0.3–0.9)
TOTAL PROTEIN: 7.2 g/dL (ref 6.5–8.1)
Total Bilirubin: 0.7 mg/dL (ref 0.3–1.2)

## 2014-10-02 LAB — COMPREHENSIVE METABOLIC PANEL
ALT: 21 U/L (ref 17–63)
AST: 38 U/L (ref 15–41)
Albumin: 3.2 g/dL — ABNORMAL LOW (ref 3.5–5.0)
Alkaline Phosphatase: 98 U/L (ref 38–126)
Anion gap: 8 (ref 5–15)
BUN: 17 mg/dL (ref 6–20)
CALCIUM: 8.6 mg/dL — AB (ref 8.9–10.3)
CO2: 23 mmol/L (ref 22–32)
Chloride: 106 mmol/L (ref 101–111)
Creatinine, Ser: 0.97 mg/dL (ref 0.61–1.24)
GFR calc Af Amer: >60 mL/min (ref >60)
GFR calc non Af Amer: >60 mL/min (ref >60)
Glucose, Bld: 128 mg/dL — ABNORMAL HIGH (ref 70–99)
POTASSIUM: 3.6 mmol/L (ref 3.5–5.1)
SODIUM: 137 mmol/L (ref 135–145)
Total Bilirubin: 0.7 mg/dL (ref 0.3–1.2)
Total Protein: 7.6 g/dL (ref 6.5–8.1)

## 2014-10-02 LAB — TROPONIN I
TROPONIN I: 0.03 ng/mL (ref <0.031)
TROPONIN I: 0.03 ng/mL (ref <0.031)
TROPONIN I: 0.03 ng/mL (ref <0.031)

## 2014-10-02 LAB — LIPID PANEL
Cholesterol: 101 mg/dL (ref 0–200)
HDL: 28 mg/dL — ABNORMAL LOW (ref >40)
LDL CALC: 61 mg/dL (ref 0–99)
TRIGLYCERIDES: 61 mg/dL (ref <150)
Total CHOL/HDL Ratio: 3.6 RATIO
VLDL: 12 mg/dL (ref 0–40)

## 2014-10-02 LAB — RAPID URINE DRUG SCREEN, HOSP PERFORMED
Amphetamines: NOT DETECTED
BARBITURATES: NOT DETECTED
Benzodiazepines: NOT DETECTED
Cocaine: NOT DETECTED
Opiates: POSITIVE — AB
TETRAHYDROCANNABINOL: NOT DETECTED

## 2014-10-02 LAB — BRAIN NATRIURETIC PEPTIDE: B Natriuretic Peptide: 986.7 pg/mL — ABNORMAL HIGH (ref 0.0–100.0)

## 2014-10-02 MED ORDER — ASPIRIN EC 81 MG PO TBEC
81.0000 mg | DELAYED_RELEASE_TABLET | Freq: Every day | ORAL | Status: DC
  Administered 2014-10-02: 81 mg via ORAL
  Filled 2014-10-02: qty 1

## 2014-10-02 MED ORDER — FUROSEMIDE 10 MG/ML IJ SOLN
40.0000 mg | Freq: Two times a day (BID) | INTRAMUSCULAR | Status: DC
  Administered 2014-10-02: 40 mg via INTRAVENOUS
  Filled 2014-10-02: qty 4

## 2014-10-02 MED ORDER — ALPRAZOLAM 0.25 MG PO TABS
0.2500 mg | ORAL_TABLET | Freq: Two times a day (BID) | ORAL | Status: DC | PRN
  Administered 2014-10-02 – 2014-10-03 (×3): 0.25 mg via ORAL
  Filled 2014-10-02 (×3): qty 1

## 2014-10-02 MED ORDER — SERTRALINE HCL 25 MG PO TABS
25.0000 mg | ORAL_TABLET | Freq: Every day | ORAL | Status: DC
Start: 2014-10-02 — End: 2014-10-03
  Administered 2014-10-02: 25 mg via ORAL
  Filled 2014-10-02: qty 1

## 2014-10-02 MED ORDER — LISINOPRIL 10 MG PO TABS
10.0000 mg | ORAL_TABLET | Freq: Every day | ORAL | Status: DC
  Administered 2014-10-02: 10 mg via ORAL
  Filled 2014-10-02: qty 1

## 2014-10-02 MED ORDER — FUROSEMIDE 40 MG PO TABS
40.0000 mg | ORAL_TABLET | Freq: Two times a day (BID) | ORAL | Status: DC
  Administered 2014-10-02: 40 mg via ORAL
  Filled 2014-10-02: qty 1

## 2014-10-02 MED ORDER — SIMVASTATIN 40 MG PO TABS
40.0000 mg | ORAL_TABLET | Freq: Every day | ORAL | Status: DC
  Administered 2014-10-02: 40 mg via ORAL
  Filled 2014-10-02: qty 1

## 2014-10-02 MED ORDER — FUROSEMIDE 10 MG/ML IJ SOLN
40.0000 mg | Freq: Once | INTRAMUSCULAR | Status: AC
  Administered 2014-10-02: 40 mg via INTRAVENOUS
  Filled 2014-10-02: qty 4

## 2014-10-02 MED ORDER — ONDANSETRON HCL 4 MG/2ML IJ SOLN: 4.0000 mg | Freq: Four times a day (QID) | INTRAMUSCULAR | Status: DC | PRN

## 2014-10-02 MED ORDER — SODIUM CHLORIDE 0.9 % IJ SOLN: 3.0000 mL | INTRAMUSCULAR | Status: DC | PRN

## 2014-10-02 MED ORDER — ACETAMINOPHEN 325 MG PO TABS
650.0000 mg | ORAL_TABLET | ORAL | Status: DC | PRN
Start: 2014-10-02 — End: 2014-10-03

## 2014-10-02 MED ORDER — CARVEDILOL 3.125 MG PO TABS
3.1250 mg | ORAL_TABLET | Freq: Two times a day (BID) | ORAL | Status: DC
  Administered 2014-10-02: 3.125 mg via ORAL
  Filled 2014-10-02: qty 1

## 2014-10-02 MED ORDER — SODIUM CHLORIDE 0.9 % IV SOLN: 250.0000 mL | INTRAVENOUS | Status: DC | PRN

## 2014-10-02 MED ORDER — LISINOPRIL 2.5 MG PO TABS
2.5000 mg | ORAL_TABLET | Freq: Every day | ORAL | Status: DC
  Filled 2014-10-02: qty 1

## 2014-10-02 MED ORDER — SODIUM CHLORIDE 0.9 % IJ SOLN
3.0000 mL | Freq: Two times a day (BID) | INTRAMUSCULAR | Status: DC
  Administered 2014-10-02: 3 mL via INTRAVENOUS

## 2014-10-02 MED ORDER — GABAPENTIN 300 MG PO CAPS
300.0000 mg | ORAL_CAPSULE | Freq: Three times a day (TID) | ORAL | Status: DC
  Administered 2014-10-02 (×3): 300 mg via ORAL
  Filled 2014-10-02 (×3): qty 1

## 2014-10-02 MED ORDER — OXYCODONE-ACETAMINOPHEN 5-325 MG PO TABS
1.0000 | ORAL_TABLET | ORAL | Status: DC | PRN
  Administered 2014-10-02 – 2014-10-03 (×4): 2 via ORAL
  Filled 2014-10-02 (×4): qty 2

## 2014-10-02 NOTE — Progress Notes (Signed)
  Echocardiogram 2D Echocardiogram has been performed.  Aris EvertsRix, Lianette Broussard A 10/02/2014, 3:44 PM

## 2014-10-02 NOTE — Progress Notes (Signed)
Pt left the floor and was found downstairs in the lobby taking magazines. Security was notified and pt returned to his room. Pt was instructed that he could not leave the floor. Informed pt that telemetry monitoring ordered and cannot monitor when off the floor. MD notified.

## 2014-10-02 NOTE — Progress Notes (Signed)
Pt wandering off floor to the east side.  Pt instructed by secretary to walk on the west side for safety reasons.  Pt suddenly verbally aggressive and threatening behavior toward staff. Security called.  AC and charge nurse made aware. Night on call NP notified and arrived to floor.  Report given to night nurse.

## 2014-10-02 NOTE — ED Notes (Signed)
Patient talking with admitting MD I will get labs when they finish.

## 2014-10-02 NOTE — Progress Notes (Signed)
[facility] PROGRESS NOTE   Mitchell Aguilar ZOX:[id number] DOB: Oct 05, 1955 DOA: 10/01/2014 PCP: No primary care provider on file.  HPI/Subjective: Denies any chest pain, some shortness of breath, can't lay flat.  Assessment/Plan: Active Problems:   CAD (coronary artery disease)   Chest pain   Cardiomegaly   S/P implantation of automatic cardioverter/defibrillator (AICD)   Hypertension   CHF, acute on chronic   Chest pain, pleuritic Patient presented with atypical chest pain, I think this might be even cardiac. Patient reported pleuritic left-sided chest pain, he pointed to recent rib fractures. 12-lead EKG and 3 sets of cardiac enzymes ruled out acute coronary syndrome. Treat pain with oral pain medications.  Acute on chronic CHF exacerbation Obtain 2-D echo, patient has +2 pedal edema and orthopnea. Started on IV Lasix 40 mg twice a day, restrict sodium and fluids intake. Follow urine outputs and daily weight closely. Continue Coreg.  Hypertension Restarted her medications.  History of subdural hematomas Patient reported about 5 subdural hematomas, reported that he had short memory loss after the get evacuation of the very first one. No history of recent hematoma.  Code Status: Full Code Family Communication: Plan discussed with the patient. Disposition Plan: Remains inpatient Diet: Diet 2 gram sodium Room service appropriate?: Yes; Fluid consistency:: Thin  Consultants:  None  Procedures:  None  Antibiotics:  None   Objective: Filed Vitals:   10/02/14 1200  BP: 117/82  Pulse: 90  Temp:   Resp:     Intake/Output Summary (Last 24 hours) at 10/02/14 1403 Last data filed at 10/02/14 0100  Gross per 24 hour  Intake      0 ml  Output    100 ml  Net   -100 ml   Filed Weights   10/02/14 0107 10/02/14 0521  Weight: 85.322 kg (188 lb 1.6 oz) 84.5 kg (186 lb 4.6 oz)    Exam: General: Alert and awake, oriented x3, not in any acute  distress. HEENT: anicteric sclera, pupils reactive to light and accommodation, EOMI CVS: S1-S2 clear, no murmur rubs or gallops Chest: clear to auscultation bilaterally, no wheezing, rales or rhonchi Abdomen: soft nontender, nondistended, normal bowel sounds, no organomegaly Extremities: +1 to +2 bilateral pedal edema Neuro: Cranial nerves II-XII intact, no focal neurological deficits  Data Reviewed: Basic Metabolic Panel:  Recent Labs Lab 10/01/14 2139 10/02/14 0516  NA 139 137  K 3.3* 3.6  CL 109 106  CO2 25 23  GLUCOSE 129* 128*  BUN 17 17  CREATININE 0.86 0.97  CALCIUM 8.4 8.6*   Liver Function Tests:  Recent Labs Lab 10/02/14 0010 10/02/14 0516  AST 36 38  ALT 22 21  ALKPHOS 95 98  BILITOT 0.7 0.7  PROT 7.2 7.6  ALBUMIN 3.1* 3.2*   No results for input(s): LIPASE, AMYLASE in the last 168 hours. No results for input(s): AMMONIA in the last 168 hours. CBC:  Recent Labs Lab 10/01/14 2139 10/02/14 0516  WBC 4.6 5.2  NEUTROABS  --  1.6*  HGB 12.0* 12.3*  HCT 37.3* 39.9  MCV 93.0 94.5  PLT 129* 126*   Cardiac Enzymes:  Recent Labs Lab 10/02/14 0010 10/02/14 0516 10/02/14 1117  TROPONINI 0.03 0.03 0.03   BNP (last 3 results)  Recent Labs  09/19/14 0525 10/01/14 2139 10/02/14 0516  BNP 1395.1* 899.2* 986.7*    ProBNP (last 3 results) No results for input(s): PROBNP in the last 8760 hours.  CBG: No results for input(s): GLUCAP in the last 168 hours.  Micro No results found for this or any previous visit (from the past 240 hour(s)).   Studies: Dg Chest Port 1 View  10/01/2014   CLINICAL DATA:  Acute onset of shortness of breath and chest pressure. Initial encounter.  EXAM: PORTABLE CHEST - 1 VIEW  COMPARISON:  Chest radiograph performed 09/24/2014  FINDINGS: The lungs are well-aerated. Vascular congestion is noted, with bilateral central airspace opacities and increased interstitial markings, concerning for pulmonary edema. No pleural  effusion or pneumothorax is seen, though the costophrenic angles are incompletely imaged on this study.  The cardiomediastinal silhouette is mildly enlarged. An AICD is noted overlying the left chest wall, with lead ending overlying the right ventricle. No acute osseous abnormalities are seen.  IMPRESSION: Vascular congestion and mild cardiomegaly, with bilateral central airspace opacities, concerning for pulmonary edema.   Electronically Signed   By: Roanna RaiderJeffery  Chang M.D.   On: 10/01/2014 22:11    Scheduled Meds: . aspirin EC  81 mg Oral Daily  . furosemide  40 mg Intravenous BID  . gabapentin  300 mg Oral TID  . lisinopril  10 mg Oral Daily  . sertraline  25 mg Oral Daily  . simvastatin  40 mg Oral Daily  . sodium chloride  3 mL Intravenous Q12H   Continuous Infusions: . sodium chloride 1,000 mL (10/01/14 2156)       Time spent: 35 minutes    [facility] LLCELMAHI,Kalimah Capurro A  [facility] Pager [phone] If 7PM-7AM, please contact night-coverage at [email or web address], password [email or web address] 10/02/2014, 2:03 PM  LOS: 0 days

## 2014-10-03 LAB — BASIC METABOLIC PANEL WITH GFR
Anion gap: 6 (ref 5–15)
BUN: 23 mg/dL — ABNORMAL HIGH (ref 6–20)
CO2: 26 mmol/L (ref 22–32)
Calcium: 8.7 mg/dL — ABNORMAL LOW (ref 8.9–10.3)
Chloride: 107 mmol/L (ref 101–111)
Creatinine, Ser: 0.89 mg/dL (ref 0.61–1.24)
GFR calc Af Amer: >60 mL/min
GFR calc non Af Amer: >60 mL/min
Glucose, Bld: 106 mg/dL — ABNORMAL HIGH (ref 70–99)
Potassium: 3.7 mmol/L (ref 3.5–5.1)
Sodium: 139 mmol/L (ref 135–145)

## 2014-10-03 MED ORDER — FUROSEMIDE 40 MG PO TABS: 40.0000 mg | ORAL_TABLET | Freq: Two times a day (BID) | ORAL | Status: DC

## 2014-10-03 MED ORDER — LISINOPRIL 10 MG PO TABS: 10.0000 mg | ORAL_TABLET | Freq: Every day | ORAL | Status: DC

## 2014-10-03 MED ORDER — CARVEDILOL 3.125 MG PO TABS: 3.1250 mg | ORAL_TABLET | Freq: Two times a day (BID) | ORAL | Status: DC

## 2014-10-03 NOTE — Progress Notes (Signed)
Dr. Arthor CaptainElmahi to bedside to assess pt and write discharge prescriptions and orders.  Pt dc'd from hospital per Dr. Harriet PhoElmahi's orders. Security to escort pt from hospital. Dot Beenark, Yancey Flemingsana Johnson

## 2014-10-03 NOTE — Discharge Summary (Signed)
Physician Discharge Summary  Mitchell Aguilar ONG:[id number] DOB: 12/29/1955 DOA: 10/01/2014  PCP: No primary care provider on file.  Admit date: 10/01/2014 Discharge date: 10/03/2014  Time spent: 25 minutes  Recommendations for Outpatient Follow-up:  1. Follow-up with primary care physician within one week.  Discharge Diagnoses:  Active Problems:   CAD (coronary artery disease)   Chest pain   Cardiomegaly   S/P implantation of automatic cardioverter/defibrillator (AICD)   Hypertension   CHF, acute on chronic   Discharge Condition: Stable  Diet recommendation: Heart healthy  Filed Weights   10/02/14 0107 10/02/14 0521 10/03/14 0641  Weight: 85.322 kg (188 lb 1.6 oz) 84.5 kg (186 lb 4.6 oz) 87.2 kg (192 lb 3.9 oz)    History of present illness:  Mitchell BrownCurtis Mattila is a 59 y.o. male   has a past medical history of MI (myocardial infarction); Cerebral hemorrhage; and Hypertension.   Patient reports history of coronary diureses status post multiple stents as well as an MI. Patient reports he had 5 stents thinks last stent was October 2015 and was having episodes of tachycardia. He is sp AICD. He has hx of recuret subdural hematomas last was 5-6 years ago. Prior to this he was living in [place]. Patient has sustained a fall last month resulting in left sided rib fractures and have been having intermittent chest pain. Patient has been self treating himself with nitroglycerin for chest pain but it doesn't always help. Patietn has run out hs lasix yesterday.  Patient has been seen in the emergency department for the same on the 16th as well as 18th. On the 18th he has left AGAINST MEDICAL ADVICE after becoming confrontational with medical staff. Today He developed chest pain and called EMS states he to 2 nitroglycerin at home without much relief. Patient reports the pain she feels like somebody sitting his chest associated with shortness of breath and radiated to left arm. He feels like it  radiated to his feet. Patient had endorse that he has ran out of his home medications he also states that he supposed to be on 2L of home oxygen chest x-ray is significant for AICD device and cardiomegaly but no evidence of pneumonia. Patient denied any fevers or chills he did some endorse some shortness of breath. Patient continues to smoke. He reports occasional cough. [facility] but upset because they are concentrating on his psychiatric problems. Patient has had trouble with short memory and violent verbal outbursts. Patient endorses being bipolar. He reports doing better on Zoloft.   Hospital Course:   Chest pain, pleuritic Patient presented with atypical chest pain, I think this not cardiac. Patient reported pleuritic left-sided chest pain, he pointed to recent rib fractures sight. 12-lead EKG and 3 sets of cardiac enzymes ruled out acute coronary syndrome. Treat pain with oral pain medications, this is improved.  Acute on chronic CHF exacerbation Obtain 2-D echo, patient has +2 pedal edema and orthopnea. Started on IV Lasix 40 mg twice a day, restrict sodium and fluids intake. On Lisinopril, continued. Added Coreg and adjusted the Lasix dose.  Hypertension Restarted her medications.  History of subdural hematomas Patient reported about 5 subdural hematomas, reported that he had short memory loss after the get evacuation of the very first one. No history of recent hematoma.  Social issues during the hospital stay Received a call from nurse yesterday that patient went off of the unit. Reported that he brought magazines from the main hospital lobby, take nurse's bottled  water. This morning patient was agitated and wants to go home, and being called by the hospital [facility] very thing in the morning that patient is agitated and wants to go home. Apparently patient wanted to leave the floor overnight, the nurse told him he cannot or he will leave against medical advise. Patient  wanted to stay till the morning, he asked me to be discharged, asked me for different prescriptions for narcotic pain medications and benzodiazepines. Prescription only given for heart failure medications, instructed to follow-up with primary care physician as soon as possible. Prescription for Coreg, Lasix and lisinopril given.   Procedures:  None  Consultations:  None  Discharge Exam: Filed Vitals:   10/03/14 0641  BP: 100/68  Pulse: 91  Temp: 97.7 F (36.5 C)  Resp: 20   General: Alert and awake, oriented x3, not in any acute distress. HEENT: anicteric sclera, pupils reactive to light and accommodation, EOMI CVS: S1-S2 clear, no murmur rubs or gallops Chest: clear to auscultation bilaterally, no wheezing, rales or rhonchi Abdomen: soft nontender, nondistended, normal bowel sounds, no organomegaly Extremities: no cyanosis, clubbing or edema noted bilaterally Neuro: Cranial nerves II-XII intact, no focal neurological deficits  Discharge Instructions   Discharge Instructions    Diet - low sodium heart healthy    Complete by:  As directed      Increase activity slowly    Complete by:  As directed           Current Discharge Medication List    START taking these medications   Details  carvedilol (COREG) 3.125 MG tablet Take 1 tablet (3.125 mg total) by mouth 2 (two) times daily with a meal. Qty: 60 tablet, Refills: 0      CONTINUE these medications which have CHANGED   Details  furosemide (LASIX) 40 MG tablet Take 1 tablet (40 mg total) by mouth 2 (two) times daily. Qty: 60 tablet, Refills: 0    lisinopril (PRINIVIL,ZESTRIL) 10 MG tablet Take 1 tablet (10 mg total) by mouth daily. Qty: 30 tablet, Refills: 0      CONTINUE these medications which have NOT CHANGED   Details  gabapentin (NEURONTIN) 300 MG capsule Take 300 mg by mouth 3 (three) times daily.    nitroGLYCERIN (NITROSTAT) 0.4 MG SL tablet Place 1 tablet (0.4 mg total) under the tongue every 5  (five) minutes as needed for chest pain. Qty: 100 tablet, Refills: 0    oxyCODONE-acetaminophen (PERCOCET/ROXICET) 5-325 MG per tablet Take 1 tablet by mouth 3 (three) times daily as needed for severe pain.    simvastatin (ZOCOR) 40 MG tablet Take 40 mg by mouth daily.       Allergies  Allergen Reactions  . Keflex [Cephalexin] Anaphylaxis  . Heparin Other (See Comments)    All over bruising       The results of significant diagnostics from this hospitalization (including imaging, microbiology, ancillary and laboratory) are listed below for reference.    Significant Diagnostic Studies: Dg Chest 2 View  09/17/2014   CLINICAL DATA:  Chest pain.  Fall 10 days prior with rib fractures.  EXAM: CHEST  2 VIEW  COMPARISON:  Chest radiograph 09/13/2014  FINDINGS: Single lead pacemaker relies the left chest wall. Cardiomediastinal contours are unchanged, mild cardiomegaly is stable. Curvilinear calcifications again seen in the region of the left ventricle. Diffuse peribronchial thickening and interstitial prominence is again seen, mildly progressed from prior. Question of developing small left pleural effusion and minimal fluid in the right minor fissure.  No pneumothorax. There is no definite confluent airspace disease. Fractures at least 3 contiguous left upper ribs.  IMPRESSION: 1. Diffuse bronchial thickening and interstitial prominence is again seen. This may reflect worsening interstitial edema versus atypical infection. Question of developing small left pleural effusion. 2. Stable borderline mild cardiomegaly and curvilinear calcifications projecting over the region of the left ventricle. 3. Left upper lateral rib fractures.  No pneumothorax.   Electronically Signed   By: Rubye Oaks M.D.   On: 09/17/2014 02:44   Dg Chest Port 1 View  10/01/2014   CLINICAL DATA:  Acute onset of shortness of breath and chest pressure. Initial encounter.  EXAM: PORTABLE CHEST - 1 VIEW  COMPARISON:  Chest  radiograph performed 09/24/2014  FINDINGS: The lungs are well-aerated. Vascular congestion is noted, with bilateral central airspace opacities and increased interstitial markings, concerning for pulmonary edema. No pleural effusion or pneumothorax is seen, though the costophrenic angles are incompletely imaged on this study.  The cardiomediastinal silhouette is mildly enlarged. An AICD is noted overlying the left chest wall, with lead ending overlying the right ventricle. No acute osseous abnormalities are seen.  IMPRESSION: Vascular congestion and mild cardiomegaly, with bilateral central airspace opacities, concerning for pulmonary edema.   Electronically Signed   By: Roanna Raider M.D.   On: 10/01/2014 22:11   Dg Chest Port 1 View  09/19/2014   CLINICAL DATA:  Initial evaluation for acute chest pain.  EXAM: PORTABLE CHEST - 1 VIEW  COMPARISON:  Prior radiograph from 09/17/2014.  FINDINGS: Left-sided transvenous pacemaker/ AICD is stable. Cardiomegaly unchanged. Curvilinear calcifications overlie the expected location the left ventricle, stable. Mediastinal silhouette within normal limits.  Lungs are mildly hypoinflated. There is diffuse pulmonary vascular congestion with indistinctness of the interstitial markings, articulated within the lung bases. This is favored to reflect pulmonary interstitial edema, although possible atypical infection could also have this appearance. There is slightly more confluent opacity within the left infrahilar region, which may reflect edema and/ or projection as the patient is rotated to the right on this film. No pneumothorax. No other focal airspace disease. No definite pleural effusion com although costophrenic angles are incompletely visualized.  Osseous structures unchanged.  IMPRESSION: 1. Stable cardiomegaly with diffuse pulmonary vascular congestion and indistinctness of the interstitial markings, greatest within the lung bases. Findings are favored to reflect pulmonary  interstitial edema, although possible atypical infection could be considered in the correct clinical setting. 2. Slightly more confluent opacity within the right infrahilar region, likely edema and/or projection as the patient is rotated on this film. Possible focal infiltrate could be considered in the correct clinical setting.   Electronically Signed   By: Rise Mu M.D.   On: 09/19/2014 06:31    Microbiology: No results found for this or any previous visit (from the past 240 hour(s)).   Labs: Basic Metabolic Panel:  Recent Labs Lab 10/01/14 2139 10/02/14 0516 10/03/14 0506  NA 139 137 139  K 3.3* 3.6 3.7  CL 109 106 107  CO2 GLUCOSE 129* 128* 106*  BUN 17 17 23*  CREATININE 0.86 0.97 0.89  CALCIUM 8.4 8.6* 8.7*   Liver Function Tests:  Recent Labs Lab 10/02/14 0010 10/02/14 0516  AST 36 38  ALT 22 21  ALKPHOS 95 98  BILITOT 0.7 0.7  PROT 7.2 7.6  ALBUMIN 3.1* 3.2*   No results for input(s): LIPASE, AMYLASE in the last 168 hours. No results for input(s): AMMONIA in the last 168  hours. CBC:  Recent Labs Lab 10/01/14 2139 10/02/14 0516  WBC 4.6 5.2  NEUTROABS  --  1.6*  HGB 12.0* 12.3*  HCT 37.3* 39.9  MCV 93.0 94.5  PLT 129* 126*   Cardiac Enzymes:  Recent Labs Lab 10/02/14 0010 10/02/14 0516 10/02/14 1117  TROPONINI 0.03 0.03 0.03   BNP: BNP (last 3 results)  Recent Labs  09/19/14 0525 10/01/14 2139 10/02/14 0516  BNP 1395.1* 899.2* 986.7*    ProBNP (last 3 results) No results for input(s): PROBNP in the last 8760 hours.  CBG: No results for input(s): GLUCAP in the last 168 hours.     Signed:  Peighton Edgin A  [facility] 10/03/2014, 7:43 AM

## 2014-10-06 ENCOUNTER — Emergency Department (HOSPITAL_COMMUNITY): Payer: Medicaid Other

## 2014-10-06 ENCOUNTER — Emergency Department (HOSPITAL_COMMUNITY)
Admission: EM | Admit: 2014-10-06 | Discharge: 2014-10-07 | Disposition: A | Discharge status: Home / Self Care | Payer: Medicaid Other | Attending: Emergency Medicine | Admitting: Emergency Medicine

## 2014-10-06 ENCOUNTER — Encounter (HOSPITAL_COMMUNITY): Payer: Self-pay

## 2014-10-06 DIAGNOSIS — I10 Essential (primary) hypertension: Secondary | ICD-10-CM | POA: Diagnosis not present

## 2014-10-06 DIAGNOSIS — J9801 Acute bronchospasm: Secondary | ICD-10-CM

## 2014-10-06 DIAGNOSIS — I5033 Acute on chronic diastolic (congestive) heart failure: Secondary | ICD-10-CM | POA: Insufficient documentation

## 2014-10-06 DIAGNOSIS — R079 Chest pain, unspecified: Secondary | ICD-10-CM | POA: Diagnosis present

## 2014-10-06 DIAGNOSIS — Z720 Tobacco use: Secondary | ICD-10-CM | POA: Diagnosis not present

## 2014-10-06 DIAGNOSIS — Z760 Encounter for issue of repeat prescription: Secondary | ICD-10-CM | POA: Insufficient documentation

## 2014-10-06 DIAGNOSIS — I252 Old myocardial infarction: Secondary | ICD-10-CM | POA: Insufficient documentation

## 2014-10-06 DIAGNOSIS — Z79899 Other long term (current) drug therapy: Secondary | ICD-10-CM | POA: Insufficient documentation

## 2014-10-06 DIAGNOSIS — I509 Heart failure, unspecified: Secondary | ICD-10-CM

## 2014-10-06 DIAGNOSIS — J219 Acute bronchiolitis, unspecified: Secondary | ICD-10-CM | POA: Diagnosis not present

## 2014-10-06 LAB — BASIC METABOLIC PANEL
Anion gap: 7 (ref 5–15)
BUN: 13 mg/dL (ref 6–20)
CO2: 24 mmol/L (ref 22–32)
Calcium: 9 mg/dL (ref 8.9–10.3)
Chloride: 110 mmol/L (ref 101–111)
Creatinine, Ser: 0.86 mg/dL (ref 0.61–1.24)
GFR calc Af Amer: >60 mL/min (ref >60)
GFR calc non Af Amer: >60 mL/min (ref >60)
Glucose, Bld: 89 mg/dL (ref 70–99)
Potassium: 3.5 mmol/L (ref 3.5–5.1)
Sodium: 141 mmol/L (ref 135–145)

## 2014-10-06 LAB — I-STAT TROPONIN, ED: Troponin i, poc: 0.02 ng/mL (ref 0.00–0.08)

## 2014-10-06 LAB — CBC
HCT: 35.5 % — ABNORMAL LOW (ref 39.0–52.0)
Hemoglobin: 11.1 g/dL — ABNORMAL LOW (ref 13.0–17.0)
MCH: 29.1 pg (ref 26.0–34.0)
MCHC: 31.3 g/dL (ref 30.0–36.0)
MCV: 93.2 fL (ref 78.0–100.0)
Platelets: 105 10*3/uL — ABNORMAL LOW (ref 150–400)
RBC: 3.81 MIL/uL — ABNORMAL LOW (ref 4.22–5.81)
RDW: 16.7 % — ABNORMAL HIGH (ref 11.5–15.5)
WBC: 5 10*3/uL (ref 4.0–10.5)

## 2014-10-06 LAB — BRAIN NATRIURETIC PEPTIDE: B Natriuretic Peptide: 1394.4 pg/mL — ABNORMAL HIGH (ref 0.0–100.0)

## 2014-10-06 MED ORDER — FUROSEMIDE 10 MG/ML IJ SOLN
60.0000 mg | Freq: Once | INTRAMUSCULAR | Status: AC
  Administered 2014-10-07: 60 mg via INTRAVENOUS
  Filled 2014-10-06: qty 6

## 2014-10-06 MED ORDER — KETOROLAC TROMETHAMINE 30 MG/ML IJ SOLN
30.0000 mg | Freq: Once | INTRAMUSCULAR | Status: AC
  Administered 2014-10-07: 30 mg via INTRAVENOUS
  Filled 2014-10-06: qty 1

## 2014-10-06 MED ORDER — ALBUTEROL SULFATE (2.5 MG/3ML) 0.083% IN NEBU
5.0000 mg | INHALATION_SOLUTION | Freq: Once | RESPIRATORY_TRACT | Status: AC
  Administered 2014-10-07: 5 mg via RESPIRATORY_TRACT
  Filled 2014-10-06: qty 6

## 2014-10-06 MED ORDER — MORPHINE SULFATE 4 MG/ML IJ SOLN
4.0000 mg | Freq: Once | INTRAMUSCULAR | Status: AC
  Administered 2014-10-07: 4 mg via INTRAVENOUS
  Filled 2014-10-06: qty 1

## 2014-10-06 NOTE — ED Provider Notes (Signed)
CSN: [id number]     Arrival date & time 10/06/14  2218 History  This chart was scribed for Mitchell BilisKevin Edra Riccardi, MD by Bronson CurbJacqueline Melvin, ED Scribe. This patient was seen in room A08C/A08C and the patient's care was started at 11:20 PM.    Chief Complaint  Patient presents with  . Chest Pain    The history is provided by the patient. No language interpreter was used.     HPI Comments: Mitchell Aguilar is a 59 y.o. male, with history of HTN, CHF, and MI, brought in by ambulance, who presents to the Emergency Department complaining of sudden onset, 7/10, nonradiating, left sided chest pain that began PTA. There is associated intermittent, persistent cough that has been ongoing for approximately 1 week, in addition to SOB and chills. He has taken nitroglycerin without relief of chest pain. Patient was admitted and discharged 3 days ago for acute CHF exacerbation and reports he lost the Lasix prescription he was given at discharge. Patient also takes lisinopril in addition to Lasix, but states he has been out of these medications for the past 3 days. He also reports that he is supposed to be on O2 at home, but states he has been having an issue with his insurance since he moved to [place] 2 months ago.   Past Medical History  Diagnosis Date  . MI (myocardial infarction)   . Cerebral hemorrhage   . Hypertension   . CHF (congestive heart failure)   . Subdural hematoma    Past Surgical History  Procedure Laterality Date  . Craniectomy     Family History  Problem Relation Age of Onset  . CAD Mother    History  Substance Use Topics  . Smoking status: Current Every Day Smoker  . Smokeless tobacco: Not on file  . Alcohol Use: Yes     Comment: occasional    Review of Systems  A complete 10 system review of systems was obtained and all systems are negative except as noted in the HPI and PMH.    Allergies  Keflex and Heparin  Home Medications   Prior to Admission medications   Medication  Sig Start Date End Date Taking? Authorizing Provider  carvedilol (COREG) 3.125 MG tablet Take 1 tablet (3.125 mg total) by mouth 2 (two) times daily with a meal. 10/03/14  Yes Clydia LlanoMutaz Elmahi, MD  furosemide (LASIX) 40 MG tablet Take 1 tablet (40 mg total) by mouth 2 (two) times daily. 10/03/14  Yes Clydia LlanoMutaz Elmahi, MD  gabapentin (NEURONTIN) 300 MG capsule Take 300 mg by mouth 3 (three) times daily.   Yes Historical Provider, MD  lisinopril (PRINIVIL,ZESTRIL) 10 MG tablet Take 1 tablet (10 mg total) by mouth daily. 10/03/14  Yes Clydia LlanoMutaz Elmahi, MD  nitroGLYCERIN (NITROSTAT) 0.4 MG SL tablet Place 1 tablet (0.4 mg total) under the tongue every 5 (five) minutes as needed for chest pain. 09/17/14  Yes Mancel BaleElliott Wentz, MD  simvastatin (ZOCOR) 40 MG tablet Take 40 mg by mouth daily.   Yes Historical Provider, MD   Triage Vitals: BP 105/80 mmHg  Pulse 95  Temp(Src) 98 F (36.7 C) (Oral)  Resp 20  Ht 5\' 10"  (1.778 m)  Wt 190 lb (86.183 kg)  BMI 27.26 kg/m2  SpO2 99%  Physical Exam  Constitutional: He is oriented to person, place, and time. He appears well-developed and well-nourished.  HENT:  Head: Normocephalic and atraumatic.  Eyes: EOM are normal.  Neck: Normal range of motion.  Cardiovascular: Normal rate, regular  rhythm, normal heart sounds and intact distal pulses.   Pulmonary/Chest: Effort normal. No respiratory distress. He has wheezes. He has rales.  Rales at bases. Mild wheezing.  Abdominal: Soft. He exhibits no distension. There is no tenderness.  Musculoskeletal: Normal range of motion.  Neurological: He is alert and oriented to person, place, and time.  Skin: Skin is warm and dry.  Psychiatric: He has a normal mood and affect. Judgment normal.  Nursing note and vitals reviewed.   ED Course  Procedures (including critical care time)  DIAGNOSTIC STUDIES: Oxygen Saturation is 99% on 2L/min [place], normal by my interpretation.    COORDINATION OF CARE: At 2325 Discussed treatment plan with  patient which includes CXR, labs, breathing treatment. Patient agrees.   Labs Review Labs Reviewed  CBC - Abnormal; Notable for the following:    RBC 3.81 (*)    Hemoglobin 11.1 (*)    HCT 35.5 (*)    RDW 16.7 (*)    Platelets 105 (*)    All other components within normal limits  BRAIN NATRIURETIC PEPTIDE - Abnormal; Notable for the following:    B Natriuretic Peptide 1394.4 (*)    All other components within normal limits  BASIC METABOLIC PANEL  Rosezena SensorI-STAT TROPOININ, ED    Imaging Review Dg Chest Port 1 View  10/06/2014   CLINICAL DATA:  Centralized chest pain and short of breath  EXAM: PORTABLE CHEST - 1 VIEW  COMPARISON:  Chest radiograph 10/01/2014  FINDINGS: Left-sided pacemaker overlies enlarged cardiac silhouette. There is calcification of the left ventricle again noted. There is mild central venous congestion. No overt pulmonary edema or pneumothorax.  IMPRESSION: Cardiomegaly and central venous congestion. No overt pulmonary edema.   Electronically Signed   By: Genevive BiStewart  Edmunds M.D.   On: 10/06/2014 23:34  I personally reviewed the imaging tests through PACS system I reviewed available ER/hospitalization records through the EMR    EKG Interpretation   Date/Time:  Thursday Oct 06 2014 22:29:35 EDT Ventricular Rate:  93 PR Interval:  155 QRS Duration: 143 QT Interval:  391 QTC Calculation: 486 R Axis:   -121 Text Interpretation:  Sinus rhythm Consider left atrial enlargement  Nonspecific intraventricular conduction delay Probable anterolateral  infarct, recent No significant change was found Confirmed by Maddox Hlavaty  MD,  Caryn BeeKEVIN ([id number]) on 10/06/2014 11:06:45 PM      MDM   Final diagnoses:  Bronchospasm  Acute on chronic congestive heart failure, unspecified congestive heart failure type  Medication refill    1:51 AM Patient feels much better this time after bronchodilators and Lasix.  I suspect this is combined bronchospasm as well as acute on chronic congestive heart  failure.  Noncompliant with his Lasix secondary to losing his medication.  Medication refills.  Feels better.  Home with albuterol inhaler.  Patient understands return to the ER for new or worsening symptoms.  He will be referred to [facility] for follow up  I personally performed the services described in this documentation, which was scribed in my presence. The recorded information has been reviewed and is accurate.      Mitchell BilisKevin Johannah Rozas, MD 10/07/14 [id number]

## 2014-10-06 NOTE — ED Notes (Addendum)
Went into room and patient took his IV out, took all equipment off. Pt stated to this RN "I am going crazy, this mood stabilizing medicine that they have me on is not working and I need to be back on zoloft" Pt denied SI/HI

## 2014-10-06 NOTE — ED Notes (Signed)
Per EMS, Patient had sudden onset of the left sided chest pain. Patient reports dizziness, SOB, and lightheadness. Patient denies any Nausea/Vomiting. Patient has Hx of COPD and CHF. Patient has been out of his Lasix for three days and is non-compliant with home oxygen. Vtals per EMS: 115/71, 89 HR, 98% on 2L. Patient has increased edema to legs bilaterally with some weeping. Patient was ambulatory upon arrival to the ED. Reports 5/10 Chest pain after 1 Nitro and 324 mg of Aspirin during transport. Patient took two Nitro before EMS arrived.

## 2014-10-07 MED ORDER — CARVEDILOL 3.125 MG PO TABS: 3.1250 mg | ORAL_TABLET | Freq: Two times a day (BID) | ORAL | Status: AC

## 2014-10-07 MED ORDER — SIMVASTATIN 40 MG PO TABS: 40.0000 mg | ORAL_TABLET | Freq: Every day | ORAL | Status: AC

## 2014-10-07 MED ORDER — GABAPENTIN 300 MG PO CAPS: 300.0000 mg | ORAL_CAPSULE | Freq: Three times a day (TID) | ORAL | Status: AC

## 2014-10-07 MED ORDER — ALBUTEROL SULFATE HFA 108 (90 BASE) MCG/ACT IN AERS
2.0000 | INHALATION_SPRAY | RESPIRATORY_TRACT | Status: DC
  Administered 2014-10-07: 2 via RESPIRATORY_TRACT
  Filled 2014-10-07: qty 6.7

## 2014-10-07 MED ORDER — LISINOPRIL 10 MG PO TABS: 10.0000 mg | ORAL_TABLET | Freq: Every day | ORAL | Status: AC

## 2014-10-07 MED ORDER — FUROSEMIDE 40 MG PO TABS: 40.0000 mg | ORAL_TABLET | Freq: Two times a day (BID) | ORAL | Status: AC

## 2014-10-07 NOTE — ED Notes (Signed)
Pt verbalized understanding of d/c instructions and has no further questions.  

## 2014-10-07 NOTE — ED Notes (Signed)
Pt given turkey sandwich and milk.

## 2015-05-04 DEATH — deceased

## 2016-10-26 IMAGING — CR DG CHEST 2V
2 series · 2 of 2 positions shown · non-contrast
Comparison: Chest radiograph 09/13/2014

CLINICAL DATA: Chest pain.  Fall 10 days prior with rib fractures.

EXAM:
CHEST  2 VIEW

[chest pa]
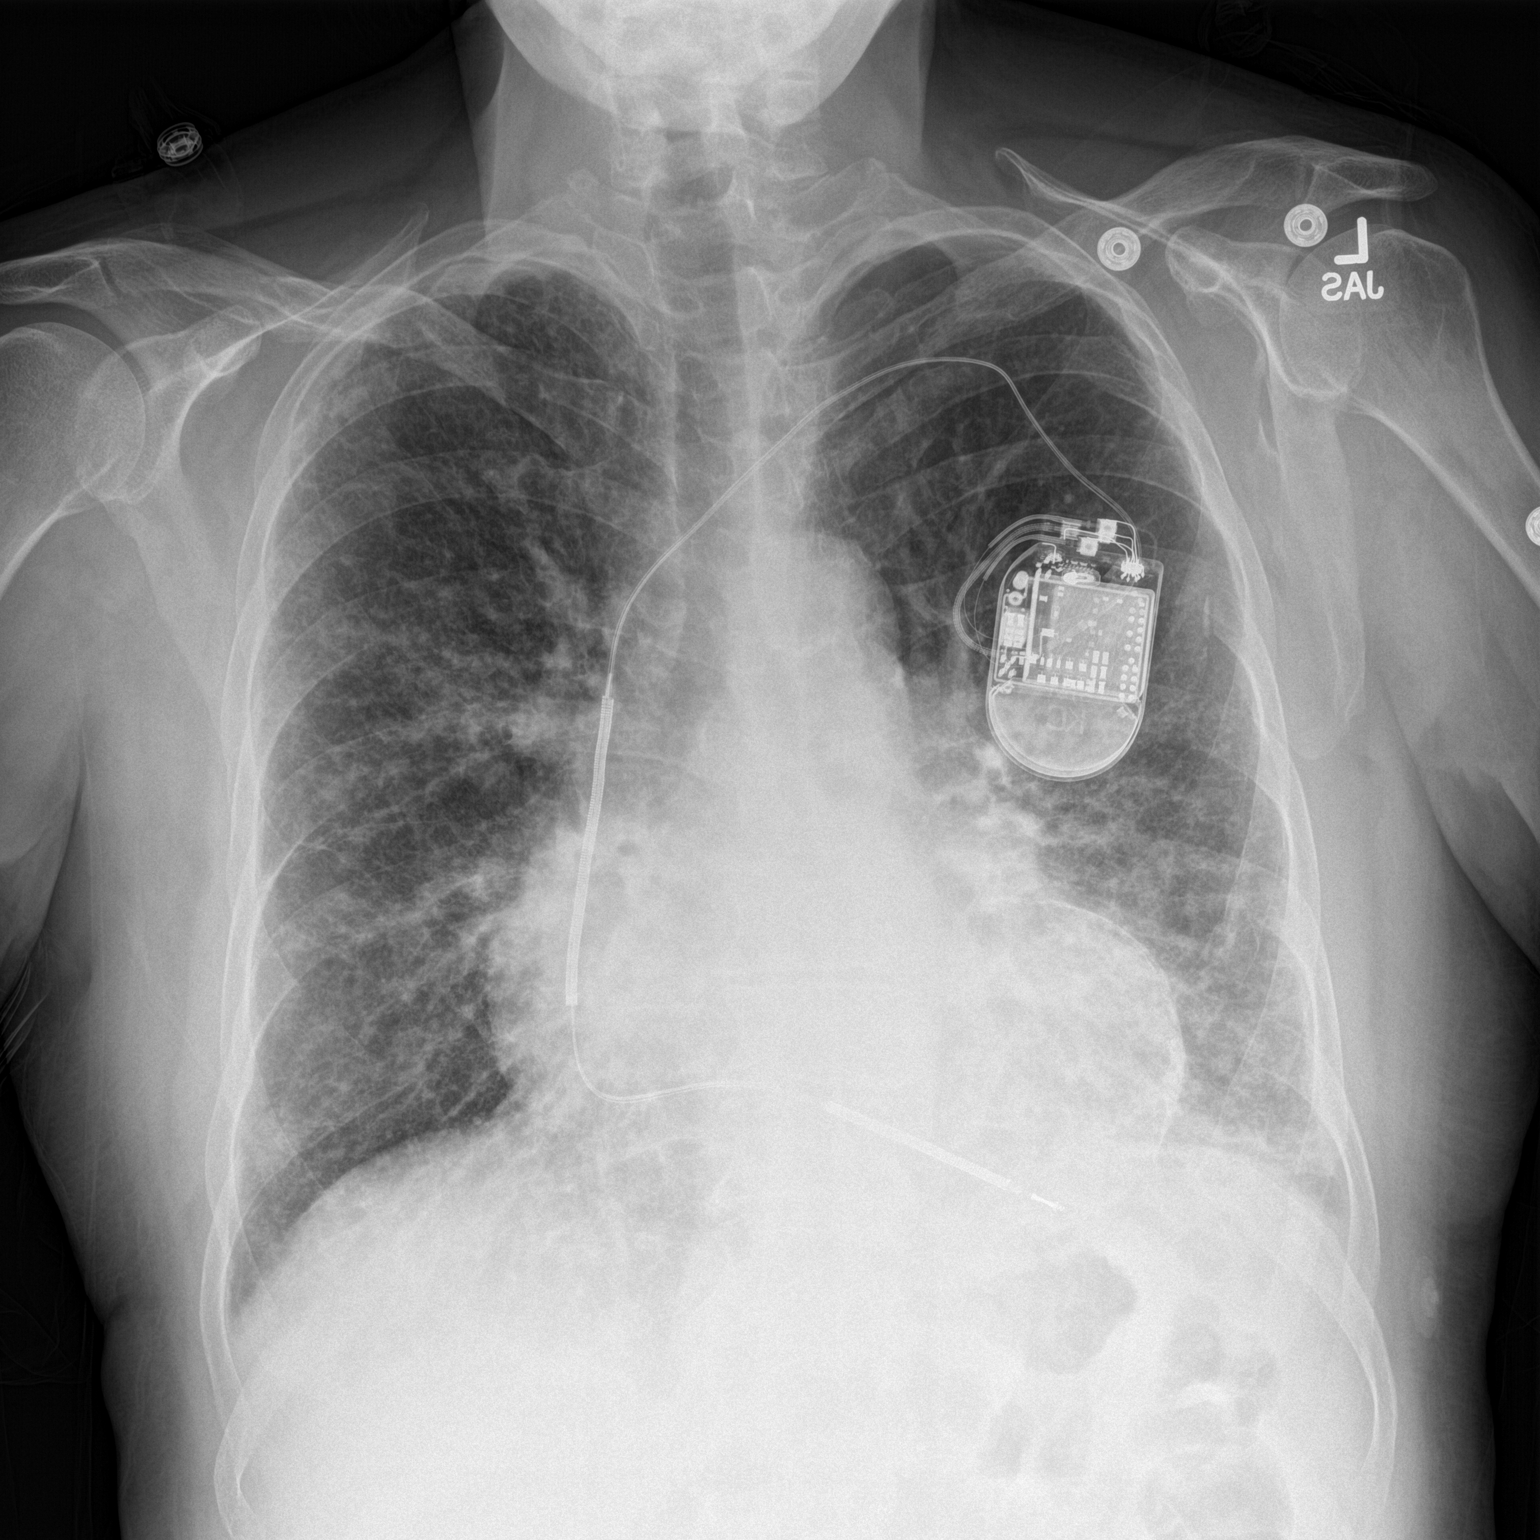

[chest lat]
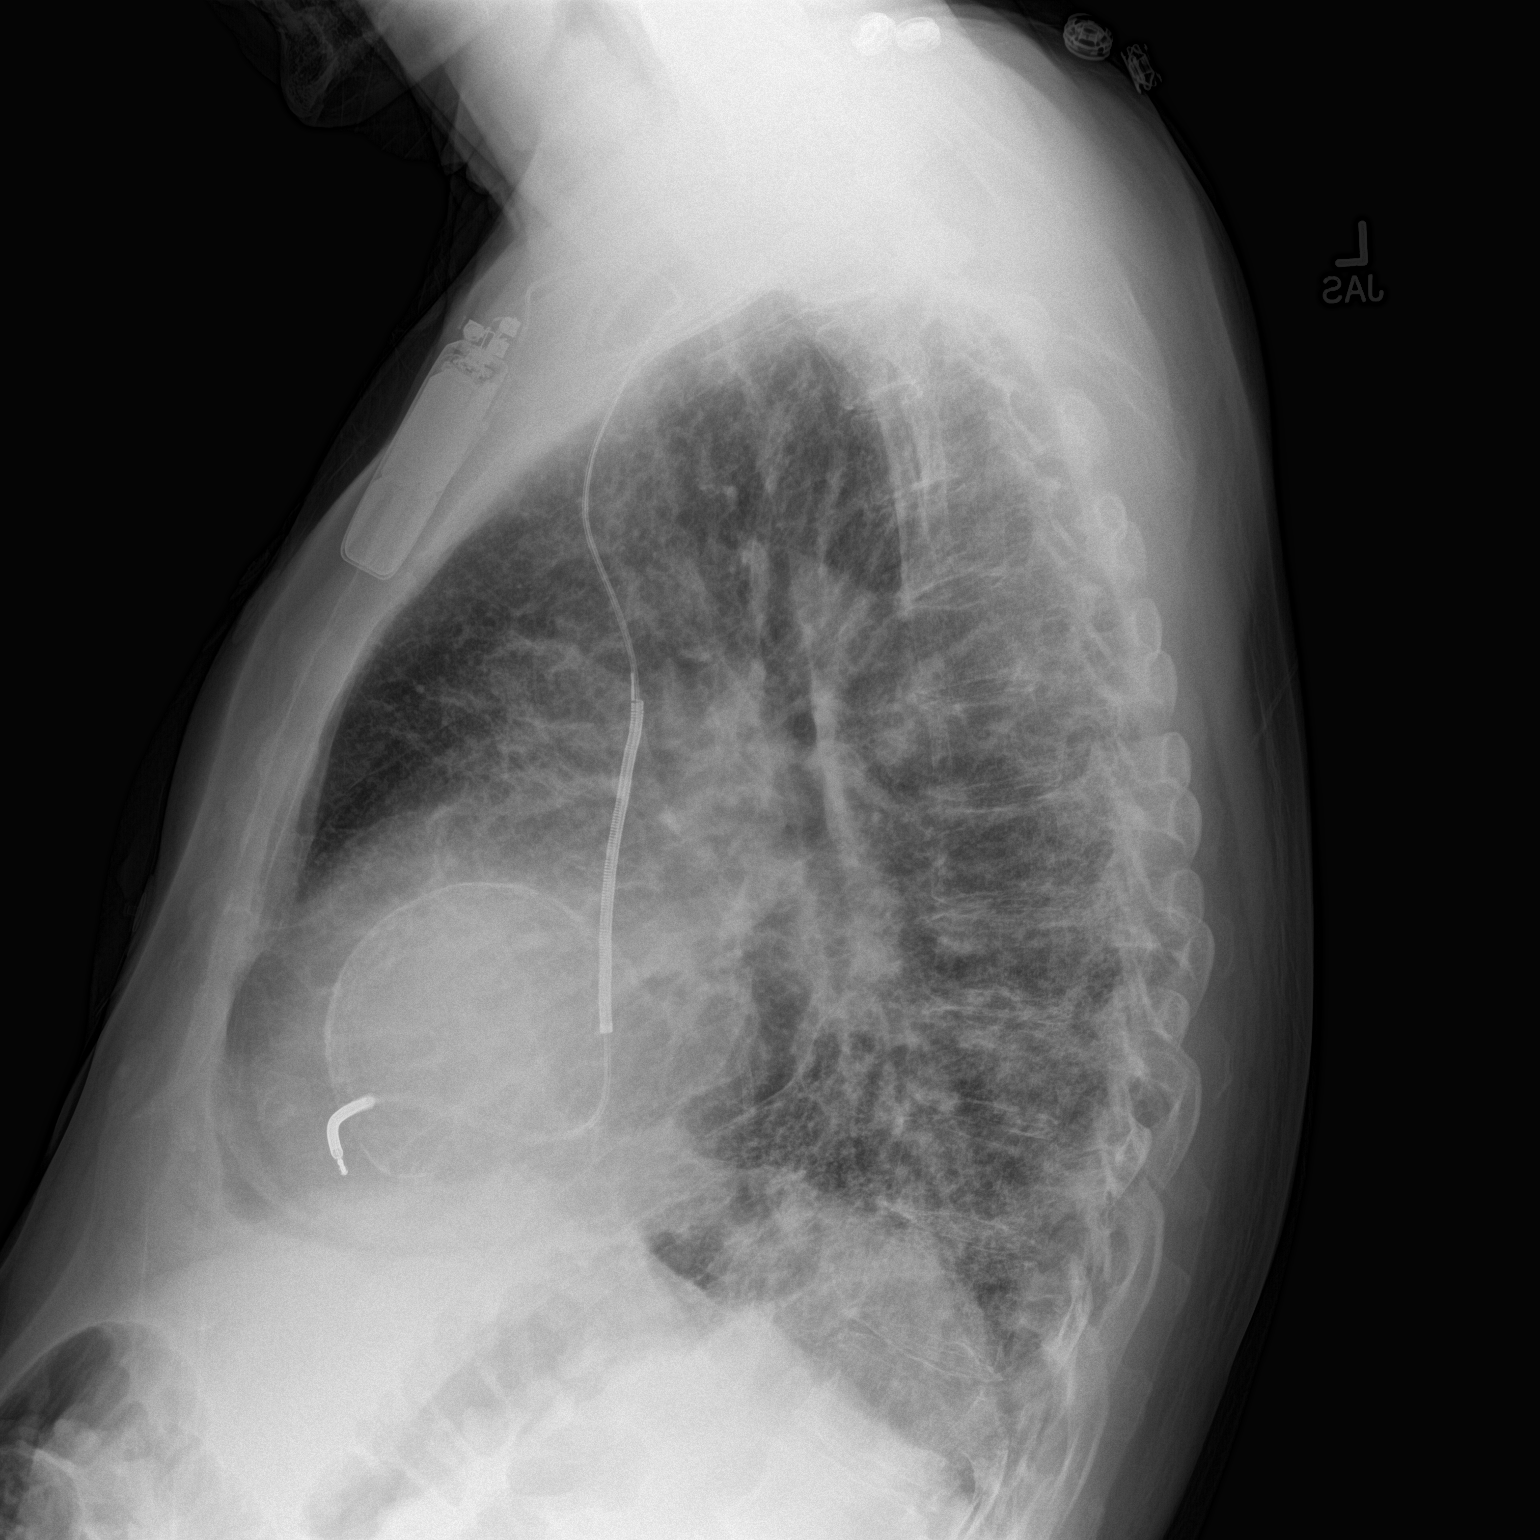

[2 of 2 positions shown; findings below may reference images not displayed]

FINDINGS: Single lead pacemaker relies the left chest wall. Cardiomediastinal
contours are unchanged, mild cardiomegaly is stable. Curvilinear
calcifications again seen in the region of the left ventricle.
Diffuse peribronchial thickening and interstitial prominence is
again seen, mildly progressed from prior. Question of developing
small left pleural effusion and minimal fluid in the right minor
fissure. No pneumothorax. There is no definite confluent airspace
disease. Fractures at least 3 contiguous left upper ribs.
IMPRESSION: 1. Diffuse bronchial thickening and interstitial prominence is again
seen. This may reflect worsening interstitial edema versus atypical
infection. Question of developing small left pleural effusion.
2. Stable borderline mild cardiomegaly and curvilinear
calcifications projecting over the region of the left ventricle.
3. Left upper lateral rib fractures.  No pneumothorax.
